# Patient Record
Sex: Male | Born: 1970 | Race: White | Hispanic: No | Marital: Single | Smoking: Former smoker
Health system: Southern US, Community
[De-identification: ages and names within clinical notes are randomized; demographics above are authoritative.]

## PROBLEM LIST (undated history)

## (undated) DIAGNOSIS — G5680 Other specified mononeuropathies of unspecified upper limb: Secondary | ICD-10-CM

## (undated) DIAGNOSIS — G709 Myoneural disorder, unspecified: Secondary | ICD-10-CM

## (undated) DIAGNOSIS — E785 Hyperlipidemia, unspecified: Secondary | ICD-10-CM

## (undated) DIAGNOSIS — M75100 Unspecified rotator cuff tear or rupture of unspecified shoulder, not specified as traumatic: Secondary | ICD-10-CM

## (undated) DIAGNOSIS — M5412 Radiculopathy, cervical region: Secondary | ICD-10-CM

## (undated) HISTORY — DX: Unspecified rotator cuff tear or rupture of unspecified shoulder, not specified as traumatic: M75.100

## (undated) HISTORY — DX: Hyperlipidemia, unspecified: E78.5

## (undated) HISTORY — DX: Other specified mononeuropathies of unspecified upper limb: G56.80

## (undated) HISTORY — DX: Radiculopathy, cervical region: M54.12

## (undated) HISTORY — PX: NECK SURGERY: SHX720

## (undated) HISTORY — DX: Myoneural disorder, unspecified: G70.9

---

## 2004-09-30 ENCOUNTER — Ambulatory Visit: Payer: Self-pay | Admitting: Family Medicine

## 2004-12-30 ENCOUNTER — Ambulatory Visit: Payer: Self-pay | Admitting: Family Medicine

## 2005-01-04 ENCOUNTER — Ambulatory Visit: Payer: Self-pay | Admitting: Family Medicine

## 2005-10-24 ENCOUNTER — Ambulatory Visit: Payer: Self-pay | Admitting: Family Medicine

## 2006-01-05 ENCOUNTER — Ambulatory Visit: Payer: Self-pay | Admitting: Family Medicine

## 2006-01-09 ENCOUNTER — Ambulatory Visit: Payer: Self-pay | Admitting: Family Medicine

## 2007-02-27 ENCOUNTER — Encounter: Payer: Self-pay | Admitting: Family Medicine

## 2007-02-27 DIAGNOSIS — E785 Hyperlipidemia, unspecified: Secondary | ICD-10-CM

## 2007-02-27 DIAGNOSIS — R7989 Other specified abnormal findings of blood chemistry: Secondary | ICD-10-CM | POA: Insufficient documentation

## 2007-03-04 ENCOUNTER — Ambulatory Visit: Payer: Self-pay | Admitting: Family Medicine

## 2007-03-04 LAB — CONVERTED CEMR LAB
AST: 32 units/L (ref 0–37)
Albumin: 3.6 g/dL (ref 3.5–5.2)
Alkaline Phosphatase: 36 units/L — ABNORMAL LOW (ref 39–117)
BUN: 18 mg/dL (ref 6–23)
Bilirubin, Direct: 0.1 mg/dL (ref 0.0–0.3)
Calcium: 8.5 mg/dL (ref 8.4–10.5)
GFR calc non Af Amer: 90 mL/min
Glucose, Bld: 91 mg/dL (ref 70–99)
LDL Cholesterol: 90 mg/dL (ref 0–99)
Potassium: 4.4 meq/L (ref 3.5–5.1)
TSH: 0.75 microintl units/mL (ref 0.35–5.50)
Total Bilirubin: 0.7 mg/dL (ref 0.3–1.2)
Total CHOL/HDL Ratio: 4
Total Protein: 5.9 g/dL — ABNORMAL LOW (ref 6.0–8.3)
Triglycerides: 50 mg/dL (ref 0–149)

## 2007-03-08 ENCOUNTER — Ambulatory Visit: Payer: Self-pay | Admitting: Family Medicine

## 2008-03-06 ENCOUNTER — Ambulatory Visit: Payer: Self-pay | Admitting: Family Medicine

## 2008-03-06 LAB — CONVERTED CEMR LAB
ALT: 50 units/L (ref 0–53)
AST: 51 units/L — ABNORMAL HIGH (ref 0–37)
Alkaline Phosphatase: 50 units/L (ref 39–117)
BUN: 14 mg/dL (ref 6–23)
Bilirubin, Direct: 0.1 mg/dL (ref 0.0–0.3)
Chloride: 103 meq/L (ref 96–112)
Cholesterol: 127 mg/dL (ref 0–200)
GFR calc non Af Amer: 89 mL/min
Total CHOL/HDL Ratio: 3.5
Triglycerides: 44 mg/dL (ref 0–149)

## 2008-03-12 ENCOUNTER — Ambulatory Visit: Payer: Self-pay | Admitting: Family Medicine

## 2008-10-01 ENCOUNTER — Ambulatory Visit: Payer: Self-pay | Admitting: Family Medicine

## 2008-10-01 DIAGNOSIS — M7020 Olecranon bursitis, unspecified elbow: Secondary | ICD-10-CM

## 2008-10-12 ENCOUNTER — Telehealth: Payer: Self-pay | Admitting: Family Medicine

## 2009-02-25 ENCOUNTER — Encounter: Payer: Self-pay | Admitting: Family Medicine

## 2009-02-25 HISTORY — PX: KNEE ARTHROSCOPY W/ PARTIAL MEDIAL MENISCECTOMY: SHX1882

## 2009-02-25 HISTORY — PX: KNEE ARTHROSCOPY W/ ACL RECONSTRUCTION: SHX1858

## 2009-03-10 ENCOUNTER — Ambulatory Visit: Payer: Self-pay | Admitting: Family Medicine

## 2009-03-10 LAB — CONVERTED CEMR LAB
ALT: 31 units/L (ref 0–53)
AST: 31 units/L (ref 0–37)
Albumin: 3.9 g/dL (ref 3.5–5.2)
Alkaline Phosphatase: 40 units/L (ref 39–117)
Bilirubin, Direct: 0 mg/dL (ref 0.0–0.3)
Calcium: 8.5 mg/dL (ref 8.4–10.5)
GFR calc non Af Amer: 99.89 mL/min (ref >60)
HDL: 36.2 mg/dL — ABNORMAL LOW (ref >39.00)
Potassium: 4.2 meq/L (ref 3.5–5.1)
Sodium: 142 meq/L (ref 135–145)
TSH: 0.67 microintl units/mL (ref 0.35–5.50)
Total Protein: 6.3 g/dL (ref 6.0–8.3)

## 2009-03-17 ENCOUNTER — Ambulatory Visit: Payer: Self-pay | Admitting: Family Medicine

## 2009-03-17 DIAGNOSIS — N342 Other urethritis: Secondary | ICD-10-CM | POA: Insufficient documentation

## 2009-03-17 DIAGNOSIS — G5600 Carpal tunnel syndrome, unspecified upper limb: Secondary | ICD-10-CM

## 2009-04-08 ENCOUNTER — Encounter: Payer: Self-pay | Admitting: Family Medicine

## 2009-07-16 ENCOUNTER — Ambulatory Visit: Payer: Self-pay | Admitting: Family Medicine

## 2009-07-16 DIAGNOSIS — A6000 Herpesviral infection of urogenital system, unspecified: Secondary | ICD-10-CM | POA: Insufficient documentation

## 2010-02-25 HISTORY — PX: OTHER SURGICAL HISTORY: SHX169

## 2010-03-15 ENCOUNTER — Telehealth (INDEPENDENT_AMBULATORY_CARE_PROVIDER_SITE_OTHER): Payer: Self-pay | Admitting: *Deleted

## 2010-03-16 ENCOUNTER — Ambulatory Visit: Payer: Self-pay | Admitting: Family Medicine

## 2010-03-16 DIAGNOSIS — R5381 Other malaise: Secondary | ICD-10-CM

## 2010-03-16 DIAGNOSIS — R5383 Other fatigue: Secondary | ICD-10-CM

## 2010-03-16 LAB — CONVERTED CEMR LAB
ALT: 39 units/L (ref 0–53)
AST: 30 units/L (ref 0–37)
Albumin: 4.2 g/dL (ref 3.5–5.2)
Alkaline Phosphatase: 42 units/L (ref 39–117)
Cholesterol: 129 mg/dL (ref 0–200)
Creatinine, Ser: 0.8 mg/dL (ref 0.4–1.5)
Eosinophils Relative: 1.4 % (ref 0.0–5.0)
GFR calc non Af Amer: 115.5 mL/min (ref >60)
Glucose, Bld: 78 mg/dL (ref 70–99)
HCT: 42 % (ref 39.0–52.0)
Hemoglobin: 14.6 g/dL (ref 13.0–17.0)
LDL Cholesterol: 82 mg/dL (ref 0–99)
Lymphocytes Relative: 28.3 % (ref 12.0–46.0)
Neutro Abs: 3 10*3/uL (ref 1.4–7.7)
Neutrophils Relative %: 59.5 % (ref 43.0–77.0)
Platelets: 203 10*3/uL (ref 150.0–400.0)
RBC: 4.47 M/uL (ref 4.22–5.81)
Sodium: 138 meq/L (ref 135–145)
TSH: 0.79 microintl units/mL (ref 0.35–5.50)
Total Bilirubin: 0.7 mg/dL (ref 0.3–1.2)

## 2010-03-23 ENCOUNTER — Ambulatory Visit: Payer: Self-pay | Admitting: Family Medicine

## 2010-03-24 LAB — CONVERTED CEMR LAB

## 2010-03-28 ENCOUNTER — Emergency Department (HOSPITAL_COMMUNITY): Admission: EM | Admit: 2010-03-28 | Discharge: 2010-03-28 | Payer: Self-pay | Admitting: Emergency Medicine

## 2010-03-31 ENCOUNTER — Encounter (INDEPENDENT_AMBULATORY_CARE_PROVIDER_SITE_OTHER): Payer: Self-pay | Admitting: *Deleted

## 2010-05-08 ENCOUNTER — Encounter: Payer: Self-pay | Admitting: Family Medicine

## 2010-08-11 ENCOUNTER — Ambulatory Visit: Payer: Self-pay | Admitting: Family Medicine

## 2010-08-11 DIAGNOSIS — H11009 Unspecified pterygium of unspecified eye: Secondary | ICD-10-CM | POA: Insufficient documentation

## 2010-09-27 NOTE — Letter (Signed)
Summary: [employer] Form/[employer]   Imported By: Sherian Rein 05/13/2010 13:22:45  _____________________________________________________________________  External Attachment:    Type:   Image     Comment:   External Document

## 2010-09-27 NOTE — Assessment & Plan Note (Signed)
Summary: CPX AND ESTABH FROM SCHALLER/DLO   Vital Signs:  Patient profile:   40 year old male Height:      70 inches Weight:      178.0 pounds BMI:     25.63 Temp:     98.6 degrees F oral Pulse rate:   76 / minute Pulse rhythm:   regular BP sitting:   102 / 76  (left arm) Cuff size:   regular  Vitals Entered By: Benny Lennert CMA Duncan Dull) (March 23, 2010 2:44 PM)  History of Present Illness: Chief complaint CPX,est from dr schaller  40 year old male:  Dorene Grebe, ACL reconstruction and meniscal, partial menisectomy on L     Preventive Screening-Counseling & Management  Alcohol-Tobacco     Alcohol drinks/day: <1     Alcohol type: wine  occas beer     Smoking Status: quit     Year Quit: 1992     Pack years: 5     Passive Smoke Exposure: no  Caffeine-Diet-Exercise     Caffeine use/day: 3     Does Patient Exercise: yes     Type of exercise: mountain bike     Times/week: 5  Hep-HIV-STD-Contraception     HIV Risk: risk noted     STD Risk: risk noted     STD Risk Counseling: to avoid increased STD risk     Testicular SE Education/Counseling to perform regular STE  Safety-Violence-Falls     Seat Belt Use: 100      Sexual History:  single now.        Drug Use:  never.    Clinical Review Panels:  Immunizations   Last Tetanus Booster:  Td (09/30/2004)  Lipid Management   Cholesterol:  129 (03/16/2010)   LDL (bad choesterol):  82 (03/16/2010)   HDL (good cholesterol):  34.10 (03/16/2010)  CBC   WBC:  5.1 (03/16/2010)   RBC:  4.47 (03/16/2010)   Hgb:  14.6 (03/16/2010)   Hct:  42.0 (03/16/2010)   Platelets:  203.0 (03/16/2010)   MCV  94.0 (03/16/2010)   MCHC  34.7 (03/16/2010)   RDW  12.8 (03/16/2010)   PMN:  59.5 (03/16/2010)   Lymphs:  28.3 (03/16/2010)   Monos:  10.0 (03/16/2010)   Eosinophils:  1.4 (03/16/2010)   Basophil:  0.8 (03/16/2010)  Complete Metabolic Panel   Glucose:  78 (03/16/2010)   Sodium:  138 (03/16/2010)   Potassium:  4.4  (03/16/2010)   Chloride:  103 (03/16/2010)   CO2:  29 (03/16/2010)   BUN:  15 (03/16/2010)   Creatinine:  0.8 (03/16/2010)   Albumin:  4.2 (03/16/2010)   Total Protein:  6.5 (03/16/2010)   Calcium:  8.9 (03/16/2010)   Total Bili:  0.7 (03/16/2010)   Alk Phos:  42 (03/16/2010)   SGPT (ALT):  39 (03/16/2010)   SGOT (AST):  30 (03/16/2010)   Allergies (verified): No Known Drug Allergies  Past History:  Past medical, surgical, family and social histories (including risk factors) reviewed, and no changes noted (except as noted below).  Past Medical History: Hyperlipidemia ACL, 02/2010, reconstruction  Past Surgical History: L ACL reconstruction, 02/2009, allograft, Dean L partial menisectomy, 02/2009, [place]  Family History: Reviewed history from 03/17/2009 and no changes required. Father: dec 80 DM, leiomyosarcoma leg Mother: Alive 49 CAD Brother A 29 Sister A 59 Sister A 49 CV:  + Mother CABG x 3 DM:  + Father Stroke:  (-)  Social History: Reviewed history from 07/16/2009 and no changes  required. Former Smoker 5 PYH, quit age 62 Alcohol use-yes. 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 Eyes: Denies blurring. ENT: Denies earache, ear discharge, decreased hearing, nasal congestion, and sore throat. CV: Denies chest pains, dyspnea on exertion, palpitations, and syncope. Resp: Denies cough, cough with exercise, dyspnea at rest, excessive sputum, nighttime cough or wheeze, and wheezing GI: Denies nausea, vomiting, diarrhea, constipation, change in bowel habits, abdominal pain, melena, BRBPR  GU: dysuria, discharge, frequency,genital sores, STD  concern. MS: oc swelling l knee Derm: No rash, itching, and dryness Neuro: No abnormal gait, frequent headaches, paresthesias, seizures, vertigo, and weakness Psych: No anxiety, behavioral problems, compulsive behavior, depression, hyperactivity, and inattentive. Endo: No polydipsia, polyphagia, polyuria, and unusual weight change Heme: No bruising or LAD Allergy: No urticaria or hayfever   Otherwise, the pertinent positives and negatives are listed above and in the HPI, otherwise a full review of systems has been reviewed and is negative unless noted positive.    Impression & Recommendations:  Problem # 1:  HEALTH MAINTENANCE EXAM (ICD-V70.0) The patient's preventative maintenance and recommended screening tests for an annual wellness exam were reviewed in full today. Brought up to date unless services declined.  Counselled on the importance of diet, exercise, and its role in overall health and mortality. The patient's FH and SH was reviewed, including their home life, tobacco status, and drug and alcohol status.   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 1 daily by mouth 2)  Ibuprofen 200 Mg Tabs (Ibuprofen) .... As needed 3)  Valacyclovir Hcl 1 Gm Tabs (Valacyclovir hcl) .Marland Kitchen.. 1 by mouth daily Prescriptions: VALACYCLOVIR HCL 1 GM TABS (VALACYCLOVIR HCL) 1 by mouth daily  #30 x 11   Entered and Authorized by:   Hannah Beat MD   Signed by:   Hannah Beat MD on 03/23/2010   Method used:   Print then Give to Patient   RxID:   [id number]   Current Allergies (reviewed today): No known allergies  Physical Exam General Appearance: well developed, well  nourished, no acute distress Eyes: conjunctiva and lids normal, PERRLA, EOMI Ears, Nose, Mouth, Throat: TM clear, nares clear, oral exam WNL Neck: supple, no lymphadenopathy, no thyromegaly, no JVD Respiratory: clear to auscultation and percussion, respiratory effort normal Cardiovascular: regular rate and rhythm, S1-S2, no murmur, rub or gallop, no bruits, peripheral pulses normal and symmetric, no cyanosis, clubbing, edema or varicosities Chest: no scars, masses, tenderness; no asymmetry, skin changes, nipple discharge, no gynecomastia   Gastrointestinal: soft, non-tender; no hepatosplenomegaly, masses; active bowel sounds all quadrants, Genitourinary: no hernia, testicular mass, penile discharge, priapism or prostate enlargement Lymphatic: no cervical, axillary or inguinal adenopathy Musculoskeletal: gait normal, muscle tone and strength WNL, no joint swelling, effusions, discoloration, crepitus  Skin: clear, good turgor, color WNL, no rashes, lesions, or ulcerations Neurologic: normal mental status, normal reflexes, normal strength, sensation, and motion Psychiatric: alert; oriented to person, place and time Other Exam:

## 2010-09-27 NOTE — Progress Notes (Signed)
----   Converted from flag ---- ---- 03/15/2010 1:03 PM, Hannah Beat MD wrote: Prephysical Labs, several days before, fasting BMP, HFP, FLP, CBC with diff, TSH v77.91, v77.1, ,780.79  ---- 03/15/2010 12:57 PM, Liane Comber CMA (AAMA) wrote: Pt is scheduled for cpx labs tomorrow, what labs to draw and dx codes? Thanks Tasha ------------------------------

## 2010-09-27 NOTE — Letter (Signed)
Summary: Nadara Eaton letter  [place] at [facility]  [address] [place], [place] [address]   Phone: [phone]  Fax: [phone]       03/31/2010 MRN: [id number]  [facility] [address] [place], [place]  [address]  Dear Mr. HOOPES,  [facility] [place], and [facility] announce the retirement of Arta Silence, M.D., from full-time practice at the [facility] office effective February 24, 2010 and his plans of returning part-time.  It is important to Dr. Hetty Ely and to our practice that you understand that [facility] has seven physicians in our office for your health care needs.  We will continue to offer the same exceptional care that you have today.    Dr. Hetty Ely has spoken to many of you about his plans for retirement and returning part-time in the fall.   We will continue to work with you through the transition to schedule appointments for you in the office and meet the high standards that [facility] is committed to.   Again, it is with great pleasure that we share the news that Dr. Hetty Ely will return to [facility] at [facility] in October of 2011 with a reduced schedule.    If you have any questions, or would like to request an appointment with one of our physicians, please call us at [phone] and press the option for Scheduling an appointment.  We take pleasure in providing you with excellent patient care and look forward to seeing you at your next office visit.  Our [facility] Physicians are:  Tillman Abide, M.D. Laurita Quint, M.D. Roxy Manns, M.D. Kerby Nora, M.D. Hannah Beat, M.D. Ruthe Mannan, M.D. We proudly welcomed Raechel Ache, M.D. and Eustaquio Boyden, M.D. to the practice in July/August 2011.  Sincerely,  [facility]

## 2010-09-29 NOTE — Assessment & Plan Note (Signed)
Summary: CHECK PLACE ON LEFT EYE/CLE   Vital Signs:  Patient profile:   40 year old male Height:      70 inches Weight:      175.8 pounds BMI:     25.32 Temp:     98.3 degrees F oral Pulse rate:   76 / minute Pulse rhythm:   regular BP sitting:   120 / 70  (left arm) Cuff size:   regular  Vitals Entered By: Benny Lennert CMA Duncan Dull) (August 11, 2010 2:05 PM)  History of Present Illness: Chief complaint check place on left eye  Pterygium: L medial eye  the patient comes in concerned about a small slightly raised area on the medial aspect of his iris. No trauma no injury, and this causes no pain. No itching.  This is some redness, but is lifelong had some prominent vasculature in this area.  Review of systems as above.  GEN: Well-developed,well-nourished,in no acute distress; alert,appropriate and cooperative throughout examination Eyes:   HEENT: Normocephalic and atraumatic without obvious abnormalities. No apparent alopecia or balding. Ears, externally no deformities PULM: Breathing comfortably in no respiratory distress EXT: No clubbing, cyanosis, or edema PSYCH: Normally interactive. Cooperative during the interview. Pleasant. Friendly and conversant. Not anxious or depressed appearing. Normal, full affect.   Allergies (verified): No Known Drug Allergies   Impression & Recommendations:  Problem # 1:  PTERYGIUM (ICD-372.40) Assessment New reassured follow for now  Complete Medication List: 1)  One-daily Multivitamins Tabs (Multiple vitamin) .Marland Kitchen.. 1 daily by mouth 2)  Ibuprofen 200 Mg Tabs (Ibuprofen) .... As needed 3)  Valacyclovir Hcl 1 Gm Tabs (Valacyclovir hcl) .Marland Kitchen.. 1 by mouth daily   Orders Added: 1)  Est. Patient Level III [56213]    Current Allergies (reviewed today): No known allergies

## 2010-11-02 ENCOUNTER — Encounter: Payer: Self-pay | Admitting: Family Medicine

## 2010-11-02 ENCOUNTER — Ambulatory Visit (INDEPENDENT_AMBULATORY_CARE_PROVIDER_SITE_OTHER): Payer: 59 | Admitting: Family Medicine

## 2010-11-02 DIAGNOSIS — Z209 Contact with and (suspected) exposure to unspecified communicable disease: Secondary | ICD-10-CM

## 2010-11-03 LAB — CONVERTED CEMR LAB

## 2010-11-08 NOTE — Assessment & Plan Note (Signed)
Summary: personal/alc   Vital Signs:  Patient profile:   40 year old male Height:      70 inches Weight:      178.50 pounds BMI:     25.70 Temp:     98.6 degrees F oral Pulse rate:   76 / minute Pulse rhythm:   regular BP sitting:   110 / 60  (left arm) Cuff size:   regular  Vitals Entered By: Benny Lennert CMA Duncan Dull) (November 02, 2010 10:08 AM)  History of Present Illness: Chief complaint personal  ? STD exposure no prot  Had a vasectomy last year 1 partner thinks she has had other partners possibly, since they broke up but have also been together recently no penile discharge or lesions hsv+  GEN: Well-developed,well-nourished,in no acute distress; alert,appropriate and cooperative throughout examination HEENT: Normocephalic and atraumatic without obvious abnormalities. No apparent alopecia or balding. Ears, externally no deformities PULM: Breathing comfortably in no respiratory distress EXT: No clubbing, cyanosis, or edema PSYCH: Normally interactive. Cooperative during the interview. Pleasant. Friendly and conversant. Not anxious or depressed appearing. Normal, full affect. 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 1 daily by mouth 2)  Ibuprofen 200 Mg Tabs (Ibuprofen) .... As needed 3)  Valacyclovir Hcl 1 Gm Tabs (Valacyclovir hcl) .Marland Kitchen.. 1 by mouth daily   Orders Added: 1)  Venipuncture [36644] 2)  T-Chlamydia  Probe, urine [id number] 3)  T-GC Probe, urine [phone] 4)  T-HIV Antibody  (Reflex) [[id number]] 5)  T-RPR  (Syphilis) [30160-10932] 6)  Est. Patient Level III [35573]    Current Allergies (reviewed today): No known allergies

## 2010-11-11 ENCOUNTER — Encounter: Payer: Self-pay | Admitting: Family Medicine

## 2010-11-11 ENCOUNTER — Ambulatory Visit (INDEPENDENT_AMBULATORY_CARE_PROVIDER_SITE_OTHER): Payer: 59 | Admitting: Family Medicine

## 2010-11-11 DIAGNOSIS — R05 Cough: Secondary | ICD-10-CM | POA: Insufficient documentation

## 2010-11-24 NOTE — Assessment & Plan Note (Signed)
Summary: COUGH,SINUS PRESSURE/CLE   [employer]   Vital Signs:  Patient profile:   40 year old male Height:      70 inches Weight:      178.75 pounds BMI:     25.74 Temp:     98.4 degrees F oral Pulse rate:   60 / minute Pulse rhythm:   regular BP sitting:   136 / 72  (left arm) Cuff size:   regular  Vitals Entered By: Delilah Shan CMA Oree Hislop Dull) (November 11, 2010 10:33 AM) CC: Cough, sinus pressure   History of Present Illness: 2.5 weeks of cough and congestions.  cough increased at night.  Coughing fits.  "Tickle in my throat."  Some sinus pressure.  More nasal discharge in the AM.  No FCNAV.  No help with mucinex.  No wheeze.  Scratchy throat.  No wheeze. This is worse than normal for the spring.   Allergies: No Known Drug Allergies  Review of Systems       See HPI.  Otherwise negative.    Physical Exam  General:  GEN: nad, alert and oriented HEENT: mucous membranes moist, TM w/o erythema, nasal epithelium injected, OP with cobblestoning, max/frontal sinuses not tender to palpation  NECK: supple w/o LA CV: rrr. PULM: ctab, no inc wob EXT: no edema    Impression & Recommendations:  Problem # 1:  COUGH (ICD-786.2) likely due to post nasal gtt. I would treat for allergic rhinitis with claritin and call back as needed.  this doesn't appear to be infectious.  He agrees with plan.   Complete Medication List: 1)  One-daily Multivitamins Tabs (Multiple vitamin) .Marland Kitchen.. 1 daily by mouth 2)  Ibuprofen 200 Mg Tabs (Ibuprofen) .... As needed 3)  Valacyclovir Hcl 1 Gm Tabs (Valacyclovir hcl) .Marland Kitchen.. 1 by mouth daily  Patient Instructions: 1)  I would take plain claritin (loratadine) 10mg  a day and use nasal saline several times a day.  Call me if you aren't improving by the middle of next week.  Take care.  Glad to see you.    Orders Added: 1)  Est. Patient Level III [60454]    Current Allergies (reviewed today): No known allergies

## 2011-01-16 ENCOUNTER — Encounter: Payer: Self-pay | Admitting: Family Medicine

## 2011-01-16 ENCOUNTER — Ambulatory Visit (INDEPENDENT_AMBULATORY_CARE_PROVIDER_SITE_OTHER): Payer: 59 | Admitting: Family Medicine

## 2011-01-16 VITALS — BP 122/58 | HR 48 | Temp 98.6°F | Wt 174.1 lb

## 2011-01-16 DIAGNOSIS — N509 Disorder of male genital organs, unspecified: Secondary | ICD-10-CM

## 2011-01-16 DIAGNOSIS — Z209 Contact with and (suspected) exposure to unspecified communicable disease: Secondary | ICD-10-CM
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
## 2011-01-16 NOTE — Patient Instructions (Signed)
We'll contact you with your lab report. Take care.    

## 2011-01-16 NOTE — Assessment & Plan Note (Signed)
Check labs.  D/w pt about safer sex.  If HIV negative, encouraged pt for fu HIV test in 2 months.  He understood.

## 2011-01-17 ENCOUNTER — Ambulatory Visit: Payer: 59 | Admitting: Family Medicine

## 2011-01-17 LAB — RPR

## 2011-01-17 LAB — HIV ANTIBODY (ROUTINE TESTING W REFLEX): HIV: NONREACTIVE

## 2011-01-17 LAB — GC/CHLAMYDIA PROBE AMP, URINE: GC Probe Amp, Urine: NEGATIVE

## 2011-03-21 ENCOUNTER — Other Ambulatory Visit: Payer: Self-pay | Admitting: Family Medicine

## 2011-03-21 DIAGNOSIS — A6000 Herpesviral infection of urogenital system, unspecified: Secondary | ICD-10-CM

## 2011-03-21 DIAGNOSIS — R7989 Other specified abnormal findings of blood chemistry: Secondary | ICD-10-CM

## 2011-03-21 DIAGNOSIS — R5381 Other malaise: Secondary | ICD-10-CM

## 2011-03-21 DIAGNOSIS — R5383 Other fatigue: Secondary | ICD-10-CM

## 2011-03-21 DIAGNOSIS — E785 Hyperlipidemia, unspecified: Secondary | ICD-10-CM

## 2011-03-23 ENCOUNTER — Other Ambulatory Visit (INDEPENDENT_AMBULATORY_CARE_PROVIDER_SITE_OTHER): Payer: 59 | Admitting: Family Medicine

## 2011-03-23 DIAGNOSIS — R7989 Other specified abnormal findings of blood chemistry: Secondary | ICD-10-CM

## 2011-03-23 DIAGNOSIS — E785 Hyperlipidemia, unspecified: Secondary | ICD-10-CM

## 2011-03-23 DIAGNOSIS — R5383 Other fatigue: Secondary | ICD-10-CM

## 2011-03-23 DIAGNOSIS — R5381 Other malaise: Secondary | ICD-10-CM

## 2011-03-23 DIAGNOSIS — A6000 Herpesviral infection of urogenital system, unspecified: Secondary | ICD-10-CM

## 2011-03-23 LAB — RENAL FUNCTION PANEL
Albumin: 4.3 g/dL (ref 3.5–5.2)
CO2: 25 mEq/L (ref 19–32)
Calcium: 8.4 mg/dL (ref 8.4–10.5)
Creatinine, Ser: 1 mg/dL (ref 0.4–1.5)
GFR: 89.61 mL/min (ref >60.00)
Glucose, Bld: 97 mg/dL (ref 70–99)
Phosphorus: 3 mg/dL (ref 2.3–4.6)
Sodium: 139 mEq/L (ref 135–145)

## 2011-03-23 LAB — HEPATIC FUNCTION PANEL
AST: 34 U/L (ref 0–37)
Total Bilirubin: 0.6 mg/dL (ref 0.3–1.2)
Total Protein: 7 g/dL (ref 6.0–8.3)

## 2011-03-23 LAB — CBC WITH DIFFERENTIAL/PLATELET
Eosinophils Relative: 0.9 % (ref 0.0–5.0)
Lymphocytes Relative: 23 % (ref 12.0–46.0)
Lymphs Abs: 1.3 10*3/uL (ref 0.7–4.0)
Neutrophils Relative %: 68.5 % (ref 43.0–77.0)
WBC: 5.5 10*3/uL (ref 4.5–10.5)

## 2011-03-23 LAB — MICROALBUMIN / CREATININE URINE RATIO
Creatinine,U: 204.4 mg/dL
Microalb Creat Ratio: 0.2 mg/g (ref 0.0–30.0)
Microalb, Ur: 0.5 mg/dL (ref 0.0–1.9)

## 2011-03-23 LAB — LIPID PANEL
Cholesterol: 122 mg/dL (ref 0–200)
LDL Cholesterol: 70 mg/dL (ref 0–99)
Total CHOL/HDL Ratio: 3
Triglycerides: 65 mg/dL (ref 0.0–149.0)

## 2011-03-23 LAB — TSH: TSH: 0.54 u[IU]/mL (ref 0.35–5.50)

## 2011-03-24 LAB — HIV ANTIBODY (ROUTINE TESTING W REFLEX): HIV: NONREACTIVE

## 2011-03-29 ENCOUNTER — Encounter: Payer: Self-pay | Admitting: Family Medicine

## 2011-03-29 ENCOUNTER — Ambulatory Visit (INDEPENDENT_AMBULATORY_CARE_PROVIDER_SITE_OTHER): Payer: 59 | Admitting: Family Medicine

## 2011-03-29 DIAGNOSIS — A6000 Herpesviral infection of urogenital system, unspecified: Secondary | ICD-10-CM

## 2011-03-29 DIAGNOSIS — R7989 Other specified abnormal findings of blood chemistry: Secondary | ICD-10-CM

## 2011-03-29 DIAGNOSIS — E785 Hyperlipidemia, unspecified: Secondary | ICD-10-CM

## 2011-03-29 DIAGNOSIS — R5383 Other fatigue: Secondary | ICD-10-CM

## 2011-03-29 DIAGNOSIS — R5381 Other malaise: Secondary | ICD-10-CM

## 2011-03-29 DIAGNOSIS — Z209 Contact with and (suspected) exposure to unspecified communicable disease: Secondary | ICD-10-CM

## 2011-03-29 NOTE — Assessment & Plan Note (Signed)
Good control. HDL slightly low but otherwise great numbers.

## 2011-03-29 NOTE — Assessment & Plan Note (Signed)
Appears to have been due to work schedule which has stabilized and he feels back to normal.

## 2011-03-29 NOTE — Assessment & Plan Note (Signed)
97 today after 70s last year. Cautioned to avoid sweets and carbs as much as possible. Microalb nml.

## 2011-03-29 NOTE — Progress Notes (Signed)
  Subjective:    Patient ID: Peter Lyons, male    DOB: [birth year], 40 y.o.   MRN: [id number]  HPI Pt here for Comp Exam. He had labs done earlier for sexual encounter, all negative and I repeated his HIV again, which is still negative and we discussed. He went back to see Dr Vernie Ammons for recurrent pain in the scrotum after his Vas and the pain was attributed to lots of cycling. He hasn't worn bike shorts for over a year and possibly needs to restart. He has no complaints today except for hemmorhoids. He denies abd straining. He uses baby wipes. He was told to use water for cleansing.    Review of Systems  Constitutional: Negative for fever, chills, diaphoresis, appetite change, fatigue and unexpected weight change.  HENT: Negative for hearing loss, ear pain, tinnitus and ear discharge.        Works around [employer] and has tinnitus if in the silence of an empty room.  Eyes: Negative for pain, discharge and visual disturbance.  Respiratory: Negative for cough, shortness of breath and wheezing.   Cardiovascular: Negative for chest pain and palpitations.       No SOB w/ exertion  Gastrointestinal: Negative for nausea, vomiting, abdominal pain, diarrhea, constipation and blood in stool.       No heartburn or swallowing problems. Has hemms (see HPI).  Genitourinary: Positive for penile swelling. Negative for dysuria, frequency and difficulty urinating.       No nocturia  Musculoskeletal: Positive for arthralgias (chronic knee discomfort. Has had tibial plateau fracture of left knee last year and had knee surgery on that knee in the past.). Negative for myalgias and back pain.  Skin: Negative for rash.       No itching or dryness.  Neurological: Negative for tremors and numbness.       No tingling or balance problems.  Hematological: Negative for adenopathy. Does not bruise/bleed easily.  Psychiatric/Behavioral: Negative for dysphoric mood and agitation.       Objective:   Physical  Exam  Constitutional: He is oriented to person, place, and time. He appears well-developed and well-nourished. No distress.  HENT:  Head: Normocephalic and atraumatic.  Right Ear: External ear normal.  Left Ear: External ear normal.  Nose: Nose normal.  Mouth/Throat: Oropharynx is clear and moist.  Eyes: Conjunctivae and EOM are normal. Pupils are equal, round, and reactive to light. Right eye exhibits no discharge. Left eye exhibits no discharge. No scleral icterus.  Neck: Normal range of motion. Neck supple. No thyromegaly present.  Cardiovascular: Normal rate, regular rhythm, normal heart sounds and intact distal pulses.   No murmur heard. Pulmonary/Chest: Effort normal and breath sounds normal. No respiratory distress. He has no wheezes.  Abdominal: Soft. Bowel sounds are normal. He exhibits no distension and no mass. There is no tenderness. There is no rebound and no guarding.  Genitourinary: Penis normal.       No hernias felt.  Musculoskeletal: Normal range of motion. He exhibits no edema.  Lymphadenopathy:    He has no cervical adenopathy.  Neurological: He is alert and oriented to person, place, and time. Coordination normal.  Skin: Skin is warm and dry. No rash noted. He is not diaphoretic.  Psychiatric: He has a normal mood and affect. His behavior is normal. Judgment and thought content normal.          Assessment & Plan:  HMPE

## 2011-03-29 NOTE — Assessment & Plan Note (Signed)
All tests negative including repeat HIV. Reassured.

## 2011-03-29 NOTE — Assessment & Plan Note (Signed)
Stable

## 2011-07-07 ENCOUNTER — Other Ambulatory Visit: Payer: Self-pay | Admitting: *Deleted

## 2011-07-07 MED ORDER — VALACYCLOVIR HCL 1 G PO TABS: 1000.0000 mg | ORAL_TABLET | Freq: Every day | ORAL | Status: DC

## 2011-07-11 ENCOUNTER — Telehealth: Payer: Self-pay | Admitting: Family Medicine

## 2011-07-11 NOTE — Telephone Encounter (Signed)
Patient notified that [facility] on [facility]07/2011. Spoke to pharmacist and was advised that they have the prescription ready for the patient to pickup.

## 2011-07-11 NOTE — Telephone Encounter (Signed)
[facility].  Insurance has changed and would like [facility].  Patient call back is [phone].

## 2012-03-20 ENCOUNTER — Other Ambulatory Visit: Payer: Self-pay | Admitting: Family Medicine

## 2012-03-20 DIAGNOSIS — E7800 Pure hypercholesterolemia, unspecified: Secondary | ICD-10-CM

## 2012-03-26 ENCOUNTER — Other Ambulatory Visit (INDEPENDENT_AMBULATORY_CARE_PROVIDER_SITE_OTHER): Payer: 59

## 2012-03-26 DIAGNOSIS — E7800 Pure hypercholesterolemia, unspecified: Secondary | ICD-10-CM

## 2012-03-26 LAB — LIPID PANEL
Cholesterol: 142 mg/dL (ref 0–200)
LDL Cholesterol: 92 mg/dL (ref 0–99)
Triglycerides: 64 mg/dL (ref 0.0–149.0)
VLDL: 12.8 mg/dL (ref 0.0–40.0)

## 2012-03-26 LAB — COMPREHENSIVE METABOLIC PANEL
ALT: 40 U/L (ref 0–53)
AST: 33 U/L (ref 0–37)
Albumin: 4 g/dL (ref 3.5–5.2)
Calcium: 8.9 mg/dL (ref 8.4–10.5)
Chloride: 106 mEq/L (ref 96–112)
Creatinine, Ser: 1 mg/dL (ref 0.4–1.5)
GFR: 90.23 mL/min (ref >60.00)
Glucose, Bld: 90 mg/dL (ref 70–99)
Sodium: 141 mEq/L (ref 135–145)
Total Protein: 6.7 g/dL (ref 6.0–8.3)

## 2012-04-02 ENCOUNTER — Encounter: Payer: 59 | Admitting: Family Medicine

## 2012-04-04 ENCOUNTER — Encounter: Payer: Self-pay | Admitting: Family Medicine

## 2012-04-04 ENCOUNTER — Ambulatory Visit (INDEPENDENT_AMBULATORY_CARE_PROVIDER_SITE_OTHER): Payer: 59 | Admitting: Family Medicine

## 2012-04-04 VITALS — BP 108/66 | HR 54 | Temp 98.3°F | Ht 70.0 in | Wt 184.8 lb

## 2012-04-04 DIAGNOSIS — R591 Generalized enlarged lymph nodes: Secondary | ICD-10-CM

## 2012-04-04 DIAGNOSIS — R599 Enlarged lymph nodes, unspecified: Secondary | ICD-10-CM

## 2012-04-04 DIAGNOSIS — Z0000 Encounter for general adult medical examination without abnormal findings: Secondary | ICD-10-CM

## 2012-04-04 DIAGNOSIS — IMO0002 Reserved for concepts with insufficient information to code with codable children: Secondary | ICD-10-CM

## 2012-04-04 DIAGNOSIS — K649 Unspecified hemorrhoids: Secondary | ICD-10-CM

## 2012-04-04 DIAGNOSIS — Z209 Contact with and (suspected) exposure to unspecified communicable disease: Secondary | ICD-10-CM

## 2012-04-04 DIAGNOSIS — T148XXA Other injury of unspecified body region, initial encounter: Secondary | ICD-10-CM

## 2012-04-04 DIAGNOSIS — A6000 Herpesviral infection of urogenital system, unspecified: Secondary | ICD-10-CM

## 2012-04-04 MED ORDER — VALACYCLOVIR HCL 1 G PO TABS: 1000.0000 mg | ORAL_TABLET | Freq: Every day | ORAL | Status: DC | PRN
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
## 2012-04-04 NOTE — Patient Instructions (Signed)
Keep your groin dry when biking.  Your labs are fine.  You don't have any noticeable lymph nodes.  If you have more lumps, notify me. Keep using prep H.  I would get a flu shot each fall.   Redo your tetanus shot in 2016.   Go to the lab on the way out.  We'll contact you with your lab report.

## 2012-04-05 ENCOUNTER — Encounter: Payer: 59 | Admitting: Family Medicine

## 2012-04-05 DIAGNOSIS — K649 Unspecified hemorrhoids: Secondary | ICD-10-CM | POA: Insufficient documentation

## 2012-04-05 DIAGNOSIS — T148XXA Other injury of unspecified body region, initial encounter: Secondary | ICD-10-CM | POA: Insufficient documentation

## 2012-04-05 DIAGNOSIS — Z0000 Encounter for general adult medical examination without abnormal findings: Secondary | ICD-10-CM | POA: Insufficient documentation

## 2012-04-05 DIAGNOSIS — R591 Generalized enlarged lymph nodes: Secondary | ICD-10-CM | POA: Insufficient documentation

## 2012-04-05 LAB — HIV ANTIBODY (ROUTINE TESTING W REFLEX): HIV: NONREACTIVE

## 2012-04-05 LAB — GC/CHLAMYDIA PROBE AMP, URINE
Chlamydia, Swab/Urine, PCR: NEGATIVE
GC Probe Amp, Urine: NEGATIVE

## 2012-04-05 NOTE — Assessment & Plan Note (Signed)
On legs, superficial and should resolve.  No sign of infected lesions.  F/u prn.

## 2012-04-05 NOTE — Assessment & Plan Note (Addendum)
Continue current meds, used occ with much fewer outbreaks. Check for other STDs today.  See notes on labs when resulted.  Normal genital exam today.  He does likely have a history of irritation from cycling.

## 2012-04-05 NOTE — Assessment & Plan Note (Signed)
Routine anticipatory guidance given to patient.  See health maintenance. Tetanus 2006 Flu shot encouraged. Labs d/w pt.   Exercising.   Living will d/w pt.  Brother is designated Product manager.   He requested STD testing.

## 2012-04-05 NOTE — Assessment & Plan Note (Signed)
Likely cause, now resolved and f/u prn.  Benign exam.

## 2012-04-05 NOTE — Assessment & Plan Note (Signed)
He'll notify me if worse.

## 2013-03-19 ENCOUNTER — Other Ambulatory Visit: Payer: Self-pay | Admitting: Family Medicine

## 2013-03-19 DIAGNOSIS — Z8249 Family history of ischemic heart disease and other diseases of the circulatory system: Secondary | ICD-10-CM

## 2013-04-01 ENCOUNTER — Other Ambulatory Visit (INDEPENDENT_AMBULATORY_CARE_PROVIDER_SITE_OTHER): Payer: 59

## 2013-04-01 DIAGNOSIS — Z0000 Encounter for general adult medical examination without abnormal findings: Secondary | ICD-10-CM

## 2013-04-01 DIAGNOSIS — Z8249 Family history of ischemic heart disease and other diseases of the circulatory system: Secondary | ICD-10-CM

## 2013-04-01 LAB — GLUCOSE, RANDOM: Glucose, Bld: 88 mg/dL (ref 70–99)

## 2013-04-01 LAB — LIPID PANEL
Cholesterol: 140 mg/dL (ref 0–200)
HDL: 39 mg/dL — ABNORMAL LOW (ref >39.00)
Triglycerides: 58 mg/dL (ref 0.0–149.0)
VLDL: 11.6 mg/dL (ref 0.0–40.0)

## 2013-04-05 ENCOUNTER — Other Ambulatory Visit: Payer: Self-pay | Admitting: Family Medicine

## 2013-04-07 ENCOUNTER — Encounter: Payer: Self-pay | Admitting: Family Medicine

## 2013-04-07 ENCOUNTER — Ambulatory Visit (INDEPENDENT_AMBULATORY_CARE_PROVIDER_SITE_OTHER): Payer: 59 | Admitting: Family Medicine

## 2013-04-07 VITALS — BP 108/68 | HR 60 | Temp 98.4°F | Ht 70.0 in | Wt 178.2 lb

## 2013-04-07 DIAGNOSIS — K649 Unspecified hemorrhoids: Secondary | ICD-10-CM

## 2013-04-07 DIAGNOSIS — M25519 Pain in unspecified shoulder: Secondary | ICD-10-CM | POA: Insufficient documentation

## 2013-04-07 DIAGNOSIS — A6000 Herpesviral infection of urogenital system, unspecified: Secondary | ICD-10-CM

## 2013-04-07 DIAGNOSIS — M549 Dorsalgia, unspecified: Secondary | ICD-10-CM

## 2013-04-07 DIAGNOSIS — M25511 Pain in right shoulder: Secondary | ICD-10-CM

## 2013-04-07 DIAGNOSIS — Z0000 Encounter for general adult medical examination without abnormal findings: Secondary | ICD-10-CM

## 2013-04-07 MED ORDER — VALACYCLOVIR HCL 1 G PO TABS: 1000.0000 mg | ORAL_TABLET | Freq: Every day | ORAL | Status: DC | PRN

## 2013-04-07 NOTE — Progress Notes (Signed)
CPE- See plan.  Routine anticipatory guidance given to patient.  See health maintenance. Tetanus 2006 Flu shot done prev, encouraged.  Diet and exercise d/w pt.  Diet is good and biking for exercise.  Living will d/w pt.  Has brother [name] designated if incapacitated.   Labs d/w pt.  Prostate and colon cancer screening not indicated.    Used valtrex prn.  No ADE.  Doing well.   R lower back pain, no trauma.  Not stretching much.  No radicular sx. Not acute.  No L sided sx  R shoulder.  Larey Seat off bike a few weeks ago.  Per patient, xrays at outside facility were neg.  Improved pain in meantime.  Prev with pain on abduction.    Hemorrhoids.  Not acute.  Itching but not bleeding.   PMH and SH reviewed  Meds, vitals, and allergies reviewed.   ROS: See HPI.  Otherwise negative.    GEN: nad, alert and oriented HEENT: mucous membranes moist NECK: supple w/o LA CV: rrr. PULM: ctab, no inc wob ABD: soft, +bs EXT: no edema SKIN: no acute rash R shoulder with normal ROM, no pain in int/ext rotation, no impingement.  AC testing wnl, no arm drop. Distally NV intact Ext rectal exam with old/resolving hemorrhoids, skin tags.  No gross blood.

## 2013-04-07 NOTE — Assessment & Plan Note (Signed)
Can use otc cream or witch hazel prn.  D/w pt.

## 2013-04-07 NOTE — Assessment & Plan Note (Signed)
Normal exam today, resolving, could have been a strain of cuff vs bursitis.  Improved, f/u prn. He agrees. D/w pt.

## 2013-04-07 NOTE — Assessment & Plan Note (Signed)
SI testing wnl, no midline pain.  R lower back isn't ttp.  Likely MSK tightness that would respond to stretching, discussed. F/u prn.

## 2013-04-07 NOTE — Assessment & Plan Note (Signed)
Continue prn valtrex.  No ADE.

## 2013-04-07 NOTE — Assessment & Plan Note (Signed)
Routine anticipatory guidance given to patient. See health maintenance.  Tetanus 2006  Flu shot done prev, encouraged.  Diet and exercise d/w pt. Diet is good and biking for exercise.  Living will d/w pt. Has brother [name] designated if incapacitated.  Labs d/w pt.  Prostate and colon cancer screening not indicated.

## 2013-04-07 NOTE — Patient Instructions (Signed)
I would get a flu shot each fall.   Try the creams (instead of ointments) for hemorrhoids and avoid straining on the toilet.  Take a stool softener if needed.  Gently (and regularly) stretch your back. Take care.  Glad to see you.

## 2013-04-09 ENCOUNTER — Telehealth: Payer: Self-pay | Admitting: Family Medicine

## 2013-04-09 NOTE — Telephone Encounter (Signed)
Neysa Bonito dropped off a [employer] form for Dr. Para March to complete. The best number to reach [place] is [phone].  Thanks, Revonda Standard

## 2013-04-09 NOTE — Telephone Encounter (Signed)
I'll work on the hard copy.  

## 2014-03-29 ENCOUNTER — Other Ambulatory Visit: Payer: Self-pay | Admitting: Family Medicine

## 2014-03-29 DIAGNOSIS — Z131 Encounter for screening for diabetes mellitus: Secondary | ICD-10-CM

## 2014-04-03 ENCOUNTER — Other Ambulatory Visit (INDEPENDENT_AMBULATORY_CARE_PROVIDER_SITE_OTHER): Payer: 59

## 2014-04-03 DIAGNOSIS — Z131 Encounter for screening for diabetes mellitus: Secondary | ICD-10-CM

## 2014-04-03 LAB — GLUCOSE, RANDOM: Glucose, Bld: 84 mg/dL (ref 70–99)

## 2014-04-09 ENCOUNTER — Encounter: Payer: Self-pay | Admitting: Family Medicine

## 2014-04-09 ENCOUNTER — Ambulatory Visit (INDEPENDENT_AMBULATORY_CARE_PROVIDER_SITE_OTHER): Payer: 59 | Admitting: Family Medicine

## 2014-04-09 ENCOUNTER — Encounter (INDEPENDENT_AMBULATORY_CARE_PROVIDER_SITE_OTHER): Payer: Self-pay

## 2014-04-09 VITALS — BP 120/64 | HR 65 | Temp 98.0°F | Ht 72.0 in | Wt 182.5 lb

## 2014-04-09 DIAGNOSIS — Z789 Other specified health status: Secondary | ICD-10-CM

## 2014-04-09 DIAGNOSIS — Z113 Encounter for screening for infections with a predominantly sexual mode of transmission: Secondary | ICD-10-CM

## 2014-04-09 DIAGNOSIS — Z0000 Encounter for general adult medical examination without abnormal findings: Secondary | ICD-10-CM

## 2014-04-09 MED ORDER — VALACYCLOVIR HCL 1 G PO TABS: 1000.0000 mg | ORAL_TABLET | Freq: Every day | ORAL | Status: DC | PRN

## 2014-04-09 NOTE — Assessment & Plan Note (Addendum)
Routine anticipatory guidance given to patient.  See health maintenance. Tetanus 2006 Shingles and PNA not due.  Flu shot d/w pt.  Usually done with a voucher at work.  Colon and PSA not due Diet and exercise are good, avid mountain biker.   Sugar wnl.  Living will d/w pt.  Brother designated if patient were incapacitated.   Asked for STD screening, reasonable to do.  No sx.  Knee exam is solid, continue exercise.   VZV status uncertain, check titer.

## 2014-04-09 NOTE — Patient Instructions (Addendum)
Go to the lab on the way out.  We'll contact you with your lab report. Take care. Glad to see you.  Keep exercising.  I would get a flu shot each fall.

## 2014-04-09 NOTE — Progress Notes (Signed)
Pre visit review using our clinic review tool, if applicable. No additional management support is needed unless otherwise documented below in the visit note.  CPE- See plan.  Routine anticipatory guidance given to patient.  See health maintenance. Tetanus 2006 Shingles and PNA not due.  Flu shot d/w pt.  Usually done with a voucher at work.  Colon and PSA not due Diet and exercise are good, avid mountain biker.   Sugar wnl.  Living will d/w pt.  Brother designated if patient were incapacitated.   Asked for STD screening, reasonable to do.  No sx.    Stretching helps his L knee. Still able to run some w/o pain during the run.  Still biking avidly.   PMH and SH reviewed  Meds, vitals, and allergies reviewed.   ROS: See HPI.  Otherwise negative.    GEN: nad, alert and oriented HEENT: mucous membranes moist NECK: supple w/o LA CV: rrr. PULM: ctab, no inc wob ABD: soft, +bs EXT: no edema SKIN: no acute rash L knee ACL feels solid, no crepitus, no meniscal click.

## 2014-04-10 LAB — GC/CHLAMYDIA PROBE AMP, URINE
CHLAMYDIA, SWAB/URINE, PCR: NEGATIVE
GC PROBE AMP, URINE: NEGATIVE

## 2014-04-10 LAB — VARICELLA ZOSTER ANTIBODY, IGG: Varicella IgG: 23.01 Index (ref <135.00)

## 2014-04-10 LAB — RPR

## 2014-04-10 LAB — HIV ANTIBODY (ROUTINE TESTING W REFLEX): HIV 1&2 Ab, 4th Generation: NONREACTIVE

## 2014-04-14 ENCOUNTER — Ambulatory Visit (INDEPENDENT_AMBULATORY_CARE_PROVIDER_SITE_OTHER): Payer: 59

## 2014-04-14 ENCOUNTER — Ambulatory Visit: Payer: 59

## 2014-04-14 DIAGNOSIS — Z23 Encounter for immunization: Secondary | ICD-10-CM

## 2014-04-15 ENCOUNTER — Telehealth: Payer: Self-pay | Admitting: Emergency Medicine

## 2014-04-15 NOTE — Telephone Encounter (Signed)
error 

## 2014-05-13 ENCOUNTER — Ambulatory Visit (INDEPENDENT_AMBULATORY_CARE_PROVIDER_SITE_OTHER): Payer: 59

## 2014-05-13 DIAGNOSIS — Z23 Encounter for immunization: Secondary | ICD-10-CM

## 2014-11-05 ENCOUNTER — Ambulatory Visit (INDEPENDENT_AMBULATORY_CARE_PROVIDER_SITE_OTHER): Payer: 59 | Admitting: Family Medicine

## 2014-11-05 ENCOUNTER — Encounter: Payer: Self-pay | Admitting: Family Medicine

## 2014-11-05 VITALS — BP 108/60 | HR 52 | Temp 98.5°F | Wt 187.5 lb

## 2014-11-05 DIAGNOSIS — Z113 Encounter for screening for infections with a predominantly sexual mode of transmission: Secondary | ICD-10-CM

## 2014-11-05 DIAGNOSIS — K649 Unspecified hemorrhoids: Secondary | ICD-10-CM

## 2014-11-05 MED ORDER — TRIAMCINOLONE ACETONIDE 0.1 % EX CREA: 1.0000 "application " | TOPICAL_CREAM | Freq: Two times a day (BID) | CUTANEOUS | Status: DC

## 2014-11-05 NOTE — Patient Instructions (Signed)
Limit hot showers.  Use TAC on the itchy spots.  Use your padded bike shorts.   This should get better.

## 2014-11-05 NOTE — Progress Notes (Signed)
Pre visit review using our clinic review tool, if applicable. No additional management support is needed unless otherwise documented below in the visit note.  This past winter he had more hemorrhoid trouble.  Will get recurrently itchy, no help with OTC creams.  He has local irritation in the perineum.  No blood in stool usually.  Did have 1 episode with hemorrhoidal bleeding in the past.  No pain.    Frequent mountain biker.  Meds, vitals, and allergies reviewed.   ROS: See HPI.  Otherwise, noncontributory.  ncat nad Ext hemorrhoid noted, not thrombosed, not ttp No gross blood.  Does have chapped skin in the perineum and the gluteal crease

## 2014-11-06 DIAGNOSIS — Z113 Encounter for screening for infections with a predominantly sexual mode of transmission: Secondary | ICD-10-CM | POA: Insufficient documentation

## 2014-11-06 LAB — GC/CHLAMYDIA PROBE AMP, URINE
CHLAMYDIA, SWAB/URINE, PCR: NEGATIVE
GC Probe Amp, Urine: NEGATIVE

## 2014-11-06 LAB — HIV ANTIBODY (ROUTINE TESTING W REFLEX): HIV 1&2 Ab, 4th Generation: NONREACTIVE

## 2014-11-06 LAB — RPR

## 2014-11-06 NOTE — Assessment & Plan Note (Signed)
Wanted screening, done today.  No sx.

## 2014-11-06 NOTE — Assessment & Plan Note (Addendum)
Likely 2 issues, intermittent hemorrhoid itching and also with chapped skin.  Hemorrhoid doesn't appear to be actively symptomatic now.  Use TAC on irritated skin, caution on biking/hot showers and f/u prn.  He agrees.

## 2015-04-02 ENCOUNTER — Other Ambulatory Visit: Payer: Self-pay | Admitting: Family Medicine

## 2015-04-02 DIAGNOSIS — Z131 Encounter for screening for diabetes mellitus: Secondary | ICD-10-CM

## 2015-04-05 ENCOUNTER — Other Ambulatory Visit (INDEPENDENT_AMBULATORY_CARE_PROVIDER_SITE_OTHER): Payer: 59

## 2015-04-05 DIAGNOSIS — Z131 Encounter for screening for diabetes mellitus: Secondary | ICD-10-CM | POA: Diagnosis not present

## 2015-04-05 LAB — GLUCOSE, RANDOM: Glucose, Bld: 93 mg/dL (ref 70–99)

## 2015-04-12 ENCOUNTER — Encounter: Payer: Self-pay | Admitting: Family Medicine

## 2015-04-12 ENCOUNTER — Ambulatory Visit (INDEPENDENT_AMBULATORY_CARE_PROVIDER_SITE_OTHER): Payer: 59 | Admitting: Family Medicine

## 2015-04-12 VITALS — BP 106/60 | HR 50 | Temp 98.2°F | Ht 72.0 in | Wt 183.0 lb

## 2015-04-12 DIAGNOSIS — Z0000 Encounter for general adult medical examination without abnormal findings: Secondary | ICD-10-CM | POA: Diagnosis not present

## 2015-04-12 DIAGNOSIS — B079 Viral wart, unspecified: Secondary | ICD-10-CM | POA: Diagnosis not present

## 2015-04-12 DIAGNOSIS — Z23 Encounter for immunization: Secondary | ICD-10-CM | POA: Diagnosis not present

## 2015-04-12 DIAGNOSIS — Z7189 Other specified counseling: Secondary | ICD-10-CM

## 2015-04-12 MED ORDER — VALACYCLOVIR HCL 1 G PO TABS: 1000.0000 mg | ORAL_TABLET | Freq: Every day | ORAL | Status: DC | PRN

## 2015-04-12 NOTE — Patient Instructions (Signed)
Take care.  Glad to see you.  Keep exercising.   Let me know if you have concerns.   I would get a flu shot each fall. Keep the wart clean and covered.  It will likely blister and then come off.

## 2015-04-12 NOTE — Progress Notes (Signed)
Pre visit review using our clinic review tool, if applicable. No additional management support is needed unless otherwise documented below in the visit note.  CPE- See plan.  Routine anticipatory guidance given to patient.  See health maintenance. Tetanus 2016 Flu prev done.  Encouraged.   PNA and shingles not due. Colon cancer and prostate cancer screening not due.  Living will d/w pt.  Brother [name] designated if patient were incapacitated.   Sugar wnl.   He has occ irritation from hemorrhoids.  He'll update me if worse.  Diet and exercise d/w pt.  Avid mountain biker.  Diet is good.    PMH and SH reviewed  Meds, vitals, and allergies reviewed.   ROS: See HPI.  Otherwise negative.    GEN: nad, alert and oriented HEENT: mucous membranes moist NECK: supple w/o LA CV: rrr. PULM: ctab, no inc wob ABD: soft, +bs EXT: no edema SKIN: no acute rash but small wart noted on L thigh.

## 2015-04-12 NOTE — Assessment & Plan Note (Signed)
He had tried OTC tx, failed.  D/w pt.  Frozen x3 today.  Routine instructions given.  He agrees.  F/u prn.  No complications.

## 2015-04-12 NOTE — Assessment & Plan Note (Signed)
Routine anticipatory guidance given to patient.  See health maintenance. Tetanus 2016 Flu prev done.  Encouraged.   PNA and shingles not due. Colon cancer and prostate cancer screening not due.  Living will d/w pt.  Brother [name] designated if patient were incapacitated.   Sugar wnl.   He has occ irritation from hemorrhoids.  He'll update me if worse.  Diet and exercise d/w pt.  Avid mountain biker.  Diet is good.

## 2015-06-01 ENCOUNTER — Ambulatory Visit: Payer: 59 | Admitting: Family Medicine

## 2015-06-03 ENCOUNTER — Ambulatory Visit: Payer: 59 | Admitting: Family Medicine

## 2015-06-08 ENCOUNTER — Ambulatory Visit (INDEPENDENT_AMBULATORY_CARE_PROVIDER_SITE_OTHER): Payer: 59 | Admitting: Family Medicine

## 2015-06-08 ENCOUNTER — Encounter: Payer: Self-pay | Admitting: Family Medicine

## 2015-06-08 VITALS — BP 106/76 | HR 53 | Temp 98.5°F | Wt 185.5 lb

## 2015-06-08 DIAGNOSIS — Z119 Encounter for screening for infectious and parasitic diseases, unspecified: Secondary | ICD-10-CM

## 2015-06-08 DIAGNOSIS — Z113 Encounter for screening for infections with a predominantly sexual mode of transmission: Secondary | ICD-10-CM | POA: Diagnosis not present

## 2015-06-08 DIAGNOSIS — B079 Viral wart, unspecified: Secondary | ICD-10-CM | POA: Diagnosis not present

## 2015-06-08 NOTE — Patient Instructions (Addendum)
We can freeze the wart again if needed.  Go to the lab on the way out.  We'll contact you with your lab report. I would get a flu shot each fall.   If the spot on your back is changing, then let me know.  Avoid sunburns.  Take care.  Glad to see you.

## 2015-06-08 NOTE — Progress Notes (Signed)
Pre visit review using our clinic review tool, if applicable. No additional management support is needed unless otherwise documented below in the visit note.  Wanted STD screening.  No symptoms.  D/w pt.  See notes on labs.    He'll get flu shot outside of clinic.   Warty lesion on L thigh.  Wanted retreatment with liq N2.  Some improvement with last tx.  No complication from tx prev.   Meds, vitals, and allergies reviewed.   ROS: See HPI.  Otherwise, noncontributory.  nad Small warty lesion noted on L thigh.  Treated x3 with liq N2, no complication and routine cautions given to patient.  Skin with 4cm irregular hypopigmented patch on the back (has been present for years per patient report w/o change- we took a picture on his phone today and he'll compare it in a few months just to make sure this isn't changing, but I don't have any reason to suspect anything other than a benign hypopigmented patch- he agrees with plan).

## 2015-06-09 DIAGNOSIS — Z113 Encounter for screening for infections with a predominantly sexual mode of transmission: Secondary | ICD-10-CM | POA: Insufficient documentation

## 2015-06-09 LAB — HIV ANTIBODY (ROUTINE TESTING W REFLEX): HIV: NONREACTIVE

## 2015-06-09 LAB — RPR

## 2015-06-09 LAB — GC/CHLAMYDIA PROBE AMP, URINE
Chlamydia, Swab/Urine, PCR: NEGATIVE
GC Probe Amp, Urine: NEGATIVE

## 2015-06-09 NOTE — Assessment & Plan Note (Signed)
See above.  Treated today.  F/u prn.  He agrees.

## 2015-06-09 NOTE — Assessment & Plan Note (Signed)
See notes on labs. 

## 2016-05-07 ENCOUNTER — Other Ambulatory Visit: Payer: Self-pay | Admitting: Family Medicine

## 2016-05-07 DIAGNOSIS — E786 Lipoprotein deficiency: Secondary | ICD-10-CM

## 2016-05-07 DIAGNOSIS — Z131 Encounter for screening for diabetes mellitus: Secondary | ICD-10-CM

## 2016-05-09 ENCOUNTER — Other Ambulatory Visit (INDEPENDENT_AMBULATORY_CARE_PROVIDER_SITE_OTHER): Payer: 59

## 2016-05-09 DIAGNOSIS — E786 Lipoprotein deficiency: Secondary | ICD-10-CM

## 2016-05-09 DIAGNOSIS — Z131 Encounter for screening for diabetes mellitus: Secondary | ICD-10-CM | POA: Diagnosis not present

## 2016-05-09 LAB — LIPID PANEL
CHOL/HDL RATIO: 4
CHOLESTEROL: 150 mg/dL (ref 0–200)
HDL: 38.6 mg/dL — AB (ref >39.00)
LDL CALC: 96 mg/dL (ref 0–99)
NonHDL: 111.61
TRIGLYCERIDES: 79 mg/dL (ref 0.0–149.0)
VLDL: 15.8 mg/dL (ref 0.0–40.0)

## 2016-05-09 LAB — GLUCOSE, RANDOM: Glucose, Bld: 93 mg/dL (ref 70–99)

## 2016-05-16 ENCOUNTER — Encounter: Payer: Self-pay | Admitting: Family Medicine

## 2016-05-16 ENCOUNTER — Ambulatory Visit (INDEPENDENT_AMBULATORY_CARE_PROVIDER_SITE_OTHER): Payer: 59 | Admitting: Family Medicine

## 2016-05-16 VITALS — BP 118/64 | HR 56 | Temp 98.5°F | Ht 70.5 in | Wt 186.0 lb

## 2016-05-16 DIAGNOSIS — Z0000 Encounter for general adult medical examination without abnormal findings: Secondary | ICD-10-CM

## 2016-05-16 DIAGNOSIS — M79603 Pain in arm, unspecified: Secondary | ICD-10-CM

## 2016-05-16 DIAGNOSIS — Z113 Encounter for screening for infections with a predominantly sexual mode of transmission: Secondary | ICD-10-CM | POA: Diagnosis not present

## 2016-05-16 MED ORDER — TRIAMCINOLONE ACETONIDE 0.1 % EX CREA: 1.0000 "application " | TOPICAL_CREAM | Freq: Two times a day (BID) | CUTANEOUS | 1 refills | Status: DC | PRN

## 2016-05-16 MED ORDER — VALACYCLOVIR HCL 1 G PO TABS: 1000.0000 mg | ORAL_TABLET | Freq: Every day | ORAL | 4 refills | Status: DC | PRN

## 2016-05-16 NOTE — Patient Instructions (Signed)
Go to the lab on the way out.  We'll contact you with your lab report. Stretch your back and update me if not better.  Take care.  Glad to see you.

## 2016-05-16 NOTE — Progress Notes (Signed)
Pre visit review using our clinic review tool, if applicable. No additional management support is needed unless otherwise documented below in the visit note. 

## 2016-05-16 NOTE — Progress Notes (Signed)
CPE- See plan.  Routine anticipatory guidance given to patient.  See health maintenance. Tetanus 2016 Flu prev done.  Encouraged.  To be done at pharmacy.  PNA and shingles not due. Colon cancer and prostate cancer screening not due.  Living will d/w pt.  Brother Tawanna Coolerodd designated if patient were incapacitated.   Sugar wnl.   He has itching/irritation from hemorrhoids.  He'll update me if worse.  Some occ bleeding. Using topical tx.  D/w pt about referral for intervention.  Diet and exercise d/w pt.  Avid mountain biker.  Diet is good.   HIV screening prev done.   Wanted STD testing.  D/w pt about safer sex.    Used TAC prn for skin irritation.  Refill done at [facility].   Recently noted changes from right lateral foot bunionette. It rubs some on his shoes but is not painful enough to need surgery or correction. No skin breakdown. Discussed at office visit.  Previously with bilateral distal deltoid pain. It would happen for series of days and then get better. It was only noted after extensive paddle boarding. No restriction in range of motion now. No pain now.  Also with left chest wall/rib/left arm pain. Started about a month ago. He can do his baseline activities, including extensive mountain biking, without any exertional symptoms. He only notes it when he sitting down. He is left-handed. It usually happens when he sitting at a desk for an extended period of time at work. He can change his position while sitting and help with the pain.  PMH and SH reviewed  Meds, vitals, and allergies reviewed.   ROS: Per HPI.  Unless specifically indicated otherwise in HPI, the patient denies:  General: fever. Eyes: acute vision changes ENT: sore throat Cardiovascular: chest pain Respiratory: SOB GI: vomiting GU: dysuria Musculoskeletal: acute back pain Derm: acute rash Neuro: acute motor dysfunction Psych: worsening mood Endocrine: polydipsia Heme: bleeding Allergy: hayfever  GEN: nad, alert and  oriented HEENT: mucous membranes moist NECK: supple w/o LA CV: rrr. PULM: ctab, no inc wob ABD: soft, +bs EXT: no edema SKIN: no acute rash Normal shoulder range of motion bilaterally. Deltoid nontender. Normal internal and external rotation shoulder. No arm drop.  Right bunionette noted. No skin breakdown. Not tender.  Back without midline pain. No rash on the left chest wall or left arm. No weakness in upper or lower extremities.

## 2016-05-17 LAB — HIV ANTIBODY (ROUTINE TESTING W REFLEX): HIV: NONREACTIVE

## 2016-05-17 LAB — GC/CHLAMYDIA PROBE AMP
CT Probe RNA: NOT DETECTED
GC Probe RNA: NOT DETECTED

## 2016-05-17 LAB — RPR

## 2016-05-18 DIAGNOSIS — M79603 Pain in arm, unspecified: Secondary | ICD-10-CM | POA: Insufficient documentation

## 2016-05-18 NOTE — Assessment & Plan Note (Addendum)
Tetanus 2016 Flu prev done.  Encouraged.  To be done at pharmacy.  PNA and shingles not due. Colon cancer and prostate cancer screening not due.  Living will d/w pt.  Brother Tawanna Coolerodd designated if patient were incapacitated.   Sugar wnl.   He has itching/irritation from hemorrhoids.  He'll update me if worse.  Some occ bleeding. Using topical tx.  D/w pt about referral for intervention.  Diet and exercise d/w pt.  Avid mountain biker.  Diet is good.   HIV screening prev done.   Wanted STD testing.  D/w pt about safer sex.   Reassured about bunionette. He'll update me if more symptomatic in the meantime.

## 2016-05-18 NOTE — Assessment & Plan Note (Signed)
Likely 2 separate issues.  #1. Likely rotator cuff irritation from extensive paddle boarding that is now resolved. Discussed with patient. He can limit his activity as needed. He had been paddle boarding up to 8 hours at a time. I think he would do better and have less symptoms if he did not have such extensive time on the water.  #2. Positional left arm and chest wall pain. He could be that he has positional nerve compression/radicular symptoms. Discussed with patient about adjusting his workstation at work. It only happens when he sitting down working. No exertional symptoms. No reason to suspect significant intrathoracic pathology. Discussed with patient about stretching his back. He'll update me as needed.

## 2016-09-07 ENCOUNTER — Ambulatory Visit (INDEPENDENT_AMBULATORY_CARE_PROVIDER_SITE_OTHER): Payer: 59 | Admitting: Family Medicine

## 2016-09-07 ENCOUNTER — Encounter (INDEPENDENT_AMBULATORY_CARE_PROVIDER_SITE_OTHER): Payer: Self-pay

## 2016-09-07 ENCOUNTER — Encounter: Payer: Self-pay | Admitting: Family Medicine

## 2016-09-07 DIAGNOSIS — L739 Follicular disorder, unspecified: Secondary | ICD-10-CM | POA: Insufficient documentation

## 2016-09-07 MED ORDER — VALACYCLOVIR HCL 1 G PO TABS: 1000.0000 mg | ORAL_TABLET | Freq: Every day | ORAL | 4 refills | Status: DC | PRN

## 2016-09-07 MED ORDER — CEPHALEXIN 500 MG PO CAPS: 500.0000 mg | ORAL_CAPSULE | Freq: Four times a day (QID) | ORAL | 0 refills | Status: DC

## 2016-09-07 NOTE — Assessment & Plan Note (Signed)
Likely folliculitis.  D/w pt.  Start keflex, f/u prn.  He agrees.  Doesn't look typical for HSV, etc.

## 2016-09-07 NOTE — Patient Instructions (Signed)
Likely folliculitis, start keflex, update me as needed.

## 2016-09-07 NOTE — Progress Notes (Signed)
Rash.  B groin. Started about 1 month ago.  Hasn't had this prior to this episode.  Not really itchy.  Not painful.   Using TAC cream w/o relief.  No dysuria.  No testicle pain.  No discharge.  No new sexual partners.  No high risk issues.    He occ has perineal irritation likely due to extensive biking.  D/w pt.    Meds, vitals, and allergies reviewed.   ROS: Per HPI unless specifically indicated in ROS section   nad ncat rrr ctab B groin rash c/w folliculitis.  No ulceration. No fluctuant mass.

## 2016-09-07 NOTE — Progress Notes (Signed)
Pre visit review using our clinic review tool, if applicable. No additional management support is needed unless otherwise documented below in the visit note. 

## 2016-11-08 DIAGNOSIS — Z0100 Encounter for examination of eyes and vision without abnormal findings: Secondary | ICD-10-CM | POA: Diagnosis not present

## 2016-11-14 ENCOUNTER — Encounter (INDEPENDENT_AMBULATORY_CARE_PROVIDER_SITE_OTHER): Payer: Self-pay

## 2016-11-14 ENCOUNTER — Ambulatory Visit (INDEPENDENT_AMBULATORY_CARE_PROVIDER_SITE_OTHER): Payer: 59 | Admitting: Primary Care

## 2016-11-14 ENCOUNTER — Encounter: Payer: Self-pay | Admitting: Primary Care

## 2016-11-14 VITALS — BP 136/68 | HR 54 | Temp 98.4°F | Ht 70.5 in | Wt 182.4 lb

## 2016-11-14 DIAGNOSIS — R21 Rash and other nonspecific skin eruption: Secondary | ICD-10-CM | POA: Diagnosis not present

## 2016-11-14 NOTE — Progress Notes (Signed)
Pre visit review using our clinic review tool, if applicable. No additional management support is needed unless otherwise documented below in the visit note. 

## 2016-11-14 NOTE — Patient Instructions (Signed)
Your rash does not represent folliculitis.   Try applying cortisone 1% cream twice daily to the skin to reduce redness and size. This may be purchased   Consider applying gold bond powder to the groin area before you start cycling.   Please call me if no improvement in rash my Friday this week.  It was a pleasure meeting you!

## 2016-11-14 NOTE — Progress Notes (Signed)
Subjective:    Patient ID: Peter Lyons, male    DOB: 06-15-1971, 46 y.o.   MRN: [id number]  HPI  Peter Lyons is a 46 year old male who presents today with a chief complaint of rash. He was diagnosed and treated for folliculitis in January 2018 with a cephalexin course.   He does a lot of cycling. His rash is located to the left groin that he first noticed 2-3 weeks ago. He denies pain, itching. He's used the [employer] ointment without improvement. Over the past several days he's noticed the rash growing size and redness. He denies recent changes in soaps, detergents, medications, clothing.  Review of Systems  Constitutional: Negative for fever.  Skin: Positive for color change and rash. Negative for wound.       Past Medical History:  Diagnosis Date  . Hyperlipidemia      Social History   Social History  . Marital status: Single    Spouse name: N/A  . Number of children: 1  . Years of education: N/A   Occupational History  . 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 Smoking status: Former Smoker    Packs/day: 0.50    Years: 5.00    Types: Cigarettes    Quit date: 08/29/1991  . Smokeless tobacco: Never Used  . Alcohol use 1.5 oz/week    3 Standard drinks or equivalent per week     Comment: on the weekends  . Drug use: No  . Sexual activity: Yes   Other Topics Concern  . Not on file   Social History Narrative   Divorced 2009   Children:  1 adult daughter   Working at [employer] (previously [employer])   [employer] biker    Past Surgical History:  Procedure Laterality Date  . KNEE ARTHROSCOPY W/ ACL RECONSTRUCTION  02/2009   Left, allograft, Dean  . KNEE ARTHROSCOPY W/ PARTIAL MEDIAL MENISCECTOMY  02/2009   Left, Dean  . LEFT KNEE TIBIAL PLATEAU FRACTURE   02/2010   Left, Dean    Family History  Problem Relation Age of Onset  . Heart disease Mother     CAD, CABG x 3  .  COPD Mother   . Diabetes Father   . Stroke Neg Hx   . Colon cancer Neg Hx   . Prostate cancer Neg Hx     No Known Allergies  Current Outpatient Prescriptions on File Prior to Visit  Medication Sig Dispense Refill  . ibuprofen (ADVIL,MOTRIN) 200 MG tablet Take 200 mg by mouth every 6 (six) hours as needed.      . valACYclovir (VALTREX) 1000 MG tablet Take 1 tablet (1,000 mg total) by mouth daily as needed. 30 tablet 4  . triamcinolone cream (KENALOG) 0.1 % Apply 1 application topically 2 (two) times daily as needed. (Patient not taking: Reported on 11/14/2016) 60 g 1   No current facility-administered medications on file prior to visit.     BP 136/68   Pulse (!) 54   Temp 98.4 F (36.9 C) (Oral)   Ht 5' 10.5" (1.791 m)   Wt 182 lb 6.4 oz (82.7 kg)   SpO2 97%   BMI 25.80 kg/m    Objective:   Physical Exam  Constitutional: He appears well-nourished.  Cardiovascular: Normal rate and regular rhythm.   Pulmonary/Chest: Effort normal and breath sounds normal.  Skin: Skin is  warm and dry.  1.5 cm circular, flat, reddened area, non scaly to left medial upper thigh. No s/s of cellulitis or infection.          Assessment & Plan:  Rash:  Located to left medial thigh x 2-3 weeks. No itching/pain, fevers. Exam today with very mild rash with approximated edges.  Does not appear infectious. Suspect heat/contact related rash and will treat with OTC cortisone 1% cream. He will call Friday this week if no improvement.  Morrie Sheldonlark,Peter Trieu Kendal, NP

## 2017-01-10 ENCOUNTER — Ambulatory Visit (INDEPENDENT_AMBULATORY_CARE_PROVIDER_SITE_OTHER): Payer: 59 | Admitting: Family Medicine

## 2017-01-10 ENCOUNTER — Encounter: Payer: Self-pay | Admitting: Family Medicine

## 2017-01-10 VITALS — BP 132/76 | HR 49 | Temp 98.2°F | Wt 185.2 lb

## 2017-01-10 DIAGNOSIS — S30860A Insect bite (nonvenomous) of lower back and pelvis, initial encounter: Secondary | ICD-10-CM

## 2017-01-10 DIAGNOSIS — W57XXXA Bitten or stung by nonvenomous insect and other nonvenomous arthropods, initial encounter: Secondary | ICD-10-CM | POA: Diagnosis not present

## 2017-01-10 NOTE — Patient Instructions (Signed)
Tick Bite Information, Adult Ticks are insects that draw blood for food. Most ticks live in shrubs and grassy areas. They climb onto people and animals that brush against the leaves and grasses that they rest on. Then they bite, attaching themselves to the skin. Most ticks are harmless, but some ticks carry germs that can spread to a person through a bite and cause a disease. To reduce your risk of getting a disease from a tick bite, it is important to take steps to prevent tick bites. It is also important to check for ticks after being outdoors. If you find that a tick has attached to you, watch for symptoms of disease. How can I prevent tick bites? Take these steps to help prevent tick bites when you are outdoors in an area where ticks are found:  Use insect repellent that has DEET (20% or higher), picaridin, or IR3535 in it. Use it on:  Skin that is showing.  The top of your boots.  Your pant legs.  Your sleeve cuffs.  For repellent products that contain permethrin, follow product instructions. Use these products on:  Clothing.  Gear.  Boots.  Tents.  Wear protective clothing. Long sleeves and long pants offer the best protection from ticks.  Wear light-colored clothing so you can see ticks more easily.  Tuck your pant legs into your socks.  If you go walking on a trail, stay in the middle of the trail so your skin, hair, and clothing do not touch the bushes.  Avoid walking through areas with long grass.  Check for ticks on your clothing, hair, and skin often while you are outside, and check again before you go inside. Make sure to check the places that ticks attach themselves most often. These places include the scalp, neck, armpits, waist, groin, and joint areas. Ticks that carry a disease called Lyme disease have to be attached to the skin for 24-48 hours. Checking for ticks every day will lessen your risk of this and other diseases.  When you come indoors, wash your  clothes and take a shower or a bath right away. Dry your clothes in a dryer on high heat for at least 60 minutes. This will kill any ticks in your clothes. What is the proper way to remove a tick? If you find a tick on your body, remove it as soon as possible. Removing a tick sooner rather than later can prevent germs from passing from the tick to your body. To remove a tick that is crawling on your skin but has not bitten:  Go outdoors and brush the tick off.  Remove the tick with tape or a lint roller. To remove a tick that is attached to your skin:  Wash your hands.  If you have latex gloves, put them on.  Use tweezers, curved forceps, or a tick-removal tool to gently grasp the tick as close to your skin and the tick's head as possible.  Gently pull with steady, upward pressure until the tick lets go. When removing the tick:  Take care to keep the tick's head attached to its body.  Do not twist or jerk the tick. This can make the tick's head or mouth break off.  Do not squeeze or crush the tick's body. This could force disease-carrying fluids from the tick into your body. Do not try to remove a tick with heat, alcohol, petroleum jelly, or fingernail polish. Using these methods can cause the tick to salivate and regurgitate into your   bloodstream, increasing your risk of getting a disease. What should I do after removing a tick?  Clean the bite area with soap and water, rubbing alcohol, or an iodine scrub.  If an antiseptic cream or ointment is available, apply a small amount to the bite site.  Wash and disinfect any instruments that you used to remove the tick. How should I dispose of a tick? To dispose of a live tick, use one of these methods:  Place it in rubbing alcohol.  Place it in a sealed bag or container.  Wrap it tightly in tape.  Flush it down the toilet. Contact a health care provider if:  You have symptoms of a disease after a tick bite. Symptoms of a  tick-borne disease can occur from moments after the tick bites to up to 30 days after a tick is removed. Symptoms include:  Muscle, joint, or bone pain.  Difficulty walking or moving your legs.  Numbness in the legs.  Paralysis.  Red rash around the tick bite area that is shaped like a target or a "bull's-eye."  Redness and swelling in the area of the tick bite.  Fever.  Repeated vomiting.  Diarrhea.  Weight loss.  Tender, swollen lymph glands.  Shortness of breath.  Cough.  Pain in the abdomen.  Headache.  Abnormal tiredness.  A change in your level of consciousness.  Confusion. Get help right away if:  You are not able to remove a tick.  A part of a tick breaks off and gets stuck in your skin.  Your symptoms get worse. Summary  Ticks may carry germs that can spread to a person through a bite and cause disease.  Wear protective clothing and use insect repellent to prevent tick bites. Follow product instructions.  If you find a tick on your body, remove it as soon as possible. If the tick is attached, do not try to remove with heat, alcohol, petroleum jelly, or fingernail polish.  Remove the attached tick using tweezers, curved forceps, or a tick-removal tool. Gently pull with steady, upward pressure until the tick lets go. Do not twist or jerk the tick. Do not squeeze or crush the tick's body.  If you have symptoms after being bitten by a tick, contact a health care provider. This information is not intended to replace advice given to you by your health care provider. Make sure you discuss any questions you have with your health care provider. Document Released: 08/11/2000 Document Revised: 05/26/2016 Document Reviewed: 05/26/2016 Elsevier Interactive Patient Education  2017 [employer]  

## 2017-01-10 NOTE — Progress Notes (Signed)
   Subjective:    Patient ID: Peter Lyons, male    DOB: [birth year], 46 y.o.   MRN: [id number]  HPI This is a 46 yo male who presents today with irritated tick bite. Took off his back 4 days ago and has itching at area. Noticed area was swollen as soon as tick removed. Deer tick, not engorged. No flu like symptoms. No headache, no fever. Area of irritation smaller today than yesterday. Has been using Benadryl cream for itch. He mountain bikes regularly and was off road when he got tick. Does not often use insect repellent.   Past Medical History:  Diagnosis Date  . Hyperlipidemia    Past Surgical History:  Procedure Laterality Date  . KNEE ARTHROSCOPY W/ ACL RECONSTRUCTION  02/2009   Left, allograft, Dean  . KNEE ARTHROSCOPY W/ PARTIAL MEDIAL MENISCECTOMY  02/2009   Left, Dean  . LEFT KNEE TIBIAL PLATEAU FRACTURE   02/2010   Left, Dean   Family History  Problem Relation Age of Onset  . Heart disease Mother        CAD, CABG x 3  . COPD Mother   . Diabetes Father   . Stroke Neg Hx   . Colon cancer Neg Hx   . Prostate cancer Neg Hx    Social History  Substance Use Topics  . Smoking status: Former Smoker    Packs/day: 0.50    Years: 5.00    Types: Cigarettes    Quit date: 08/29/1991  . Smokeless tobacco: Never Used  . Alcohol use 1.5 oz/week    3 Standard drinks or equivalent per week     Comment: on the weekends      Review of Systems  Constitutional: Negative for fever.  Musculoskeletal: Negative for myalgias.  Skin: Positive for wound.  Neurological: Negative for headaches.       Objective:   Physical Exam  Constitutional: He is oriented to person, place, and time. He appears well-developed and well-nourished. No distress.  HENT:  Head: Normocephalic and atraumatic.  Eyes: Conjunctivae are normal.  Pulmonary/Chest: Effort normal.  Neurological: He is alert and oriented to person, place, and time.  Skin: Skin is warm and dry. He is not diaphoretic.       Psychiatric: He has a normal mood and affect. His behavior is normal. Judgment and thought content normal.      BP 132/76 (BP Location: Right Arm, Patient Position: Sitting, Cuff Size: Normal)   Pulse (!) 49   Temp 98.2 F (36.8 C) (Oral)   Wt 185 lb 3.2 oz (84 kg)   SpO2 97%   BMI 26.20 kg/m      Assessment & Plan:  1. Tick bite of back, initial encounter - localized irritation, discussed covering with bandage to avoid waistband irritation - Provided written and verbal information regarding diagnosis and treatment. - RTC precautions rewiewed - encouraged him to wear insect repellent or treat clothing  Olean Reeeborah Gessner, FNP-BC  [facility] [place], [facility]  01/10/2017 1:20 PM

## 2017-02-22 ENCOUNTER — Encounter: Payer: Self-pay | Admitting: Family Medicine

## 2017-02-22 ENCOUNTER — Ambulatory Visit (INDEPENDENT_AMBULATORY_CARE_PROVIDER_SITE_OTHER): Payer: 59 | Admitting: Family Medicine

## 2017-02-22 VITALS — BP 124/68 | HR 61 | Temp 98.6°F | Wt 179.8 lb

## 2017-02-22 DIAGNOSIS — M25472 Effusion, left ankle: Secondary | ICD-10-CM

## 2017-02-22 DIAGNOSIS — R2232 Localized swelling, mass and lump, left upper limb: Secondary | ICD-10-CM | POA: Insufficient documentation

## 2017-02-22 NOTE — Patient Instructions (Addendum)
I don't think these are lymph nodes but rather inflammation of skin.  Use warm compresses to area. This may be all that is needed to resolve spots.  If enlarging or worsening pain, let us know for antibiotic course.  Keep leg elevated, let us know if ankle swelling returns without trigger.

## 2017-02-22 NOTE — Assessment & Plan Note (Signed)
This has also significantly improved.  Possible soft tissue bleeding from prior thigh trauma 1 mo ago with resultant pooling lateral left foot several weeks later. Reassured.

## 2017-02-22 NOTE — Progress Notes (Signed)
BP 124/68   Pulse 61   Temp 98.6 F (37 C) (Oral)   Wt 179 lb 12 oz (81.5 kg)   SpO2 94%   BMI 25.43 kg/m    CC: check lymph node and L ankle swelling Subjective:    Patient ID: Peter Lyons, male    DOB: 1971/01/19, 46 y.o.   MRN: [id number]  HPI: Peter Lyons is a 46 y.o. male presenting on 02/22/2017 for Lymphadenopathy and Edema (L ankle)   Last night noted tender swelling under left axilla. Even tender to put on deodorant.   Monday night noted night chills but would wake up fine.  He did notice significant left lateral ankle swelling and bruising Tuesday morning - treated with ice, elevation, ace compression wrap. This significantly helped. Minimal swelling since. Right leg never affected. Denies inciting trauma/injury.   01/20/2017 - hit L leg while mountain biking. "I thought I broke my leg" but he was able to pedal well afterwards. Did not seek care. sxs improved over next 2 wks. Was able to surf at beach last week without discomfort.   Avid mountain biker.  Father with h/o leiomyosarcoma of leg.  He has had boils in groin in the past.  Denies night sweats, fatigue, unexpected weight loss, coughing, nasal congestion, recent cold.   Relevant past medical, surgical, family and social history reviewed and updated as indicated. Interim medical history since our last visit reviewed. Allergies and medications reviewed and updated. Outpatient Medications Prior to Visit  Medication Sig Dispense Refill  . ibuprofen (ADVIL,MOTRIN) 200 MG tablet Take 200 mg by mouth every 6 (six) hours as needed.      . triamcinolone cream (KENALOG) 0.1 % Apply 1 application topically 2 (two) times daily as needed. 60 g 1  . valACYclovir (VALTREX) 1000 MG tablet Take 1 tablet (1,000 mg total) by mouth daily as needed. 30 tablet 4   No facility-administered medications prior to visit.      Per HPI unless specifically indicated in ROS section below Review of Systems     Objective:    BP 124/68   Pulse 61   Temp 98.6 F (37 C) (Oral)   Wt 179 lb 12 oz (81.5 kg)   SpO2 94%   BMI 25.43 kg/m   Wt Readings from Last 3 Encounters:  02/22/17 179 lb 12 oz (81.5 kg)  01/10/17 185 lb 3.2 oz (84 kg)  11/14/16 182 lb 6.4 oz (82.7 kg)    Physical Exam  Constitutional: He appears well-developed and well-nourished. No distress.  Musculoskeletal: He exhibits no edema.  FROM at bilateral ankles  Lymphadenopathy:       Head (right side): No submental, no submandibular, no tonsillar, no preauricular, no posterior auricular and no occipital adenopathy present.       Head (left side): No submental, no submandibular, no tonsillar, no preauricular, no posterior auricular and no occipital adenopathy present.    He has no axillary adenopathy.       Right axillary: No lateral adenopathy present.       Left axillary: No lateral adenopathy present.      Right: No inguinal and no supraclavicular adenopathy present.       Left: No inguinal and no supraclavicular adenopathy present.  Skin: Skin is warm and dry. No rash noted. There is erythema.  Slight erythema with small superficial lumps palpated under L axilla, actually improved from yesterday   Nursing note and vitals reviewed.     Assessment &  Plan:   Problem List Items Addressed This Visit    Axillary lump, left - Primary    Anticipate resolving small boils of left axilla - anticipate continue to improve with supportive care - warm compresses, NSAID - as significant improvement endorsed over last 24 hours. Discussed if recurrent or worsening to notify me for abx course. Discussed indications to return for possible I&D       Left ankle swelling    This has also significantly improved.  Possible soft tissue bleeding from prior thigh trauma 1 mo ago with resultant pooling lateral left foot several weeks later. Reassured.           Follow up plan: Return if symptoms worsen or fail to improve.  Eustaquio Boyden, MD

## 2017-02-22 NOTE — Assessment & Plan Note (Signed)
Anticipate resolving small boils of left axilla - anticipate continue to improve with supportive care - warm compresses, NSAID - as significant improvement endorsed over last 24 hours. Discussed if recurrent or worsening to notify me for abx course. Discussed indications to return for possible I&D

## 2017-03-15 ENCOUNTER — Telehealth: Payer: Self-pay

## 2017-03-15 NOTE — Telephone Encounter (Signed)
Appointment scheduled.

## 2017-03-15 NOTE — Telephone Encounter (Signed)
Pt left v/m; pt was seen 01/10/17 for tick bite and now pt is having swelling around ankle and pt request lab test for lyme disease. Pt request cb.

## 2017-03-15 NOTE — Telephone Encounter (Signed)
Needs OV, we can do labs at the OV if needed.

## 2017-03-16 ENCOUNTER — Encounter: Payer: Self-pay | Admitting: Family Medicine

## 2017-03-16 ENCOUNTER — Ambulatory Visit (INDEPENDENT_AMBULATORY_CARE_PROVIDER_SITE_OTHER): Payer: 59 | Admitting: Family Medicine

## 2017-03-16 VITALS — BP 110/66 | HR 53 | Temp 98.4°F | Wt 181.2 lb

## 2017-03-16 DIAGNOSIS — W57XXXS Bitten or stung by nonvenomous insect and other nonvenomous arthropods, sequela: Secondary | ICD-10-CM | POA: Diagnosis not present

## 2017-03-16 DIAGNOSIS — S30860S Insect bite (nonvenomous) of lower back and pelvis, sequela: Secondary | ICD-10-CM | POA: Diagnosis not present

## 2017-03-16 LAB — CBC WITH DIFFERENTIAL/PLATELET
BASOS ABS: 0.1 10*3/uL (ref 0.0–0.1)
Basophils Relative: 0.9 % (ref 0.0–3.0)
EOS ABS: 0.1 10*3/uL (ref 0.0–0.7)
Eosinophils Relative: 2 % (ref 0.0–5.0)
HEMATOCRIT: 41.7 % (ref 39.0–52.0)
HEMOGLOBIN: 14 g/dL (ref 13.0–17.0)
LYMPHS PCT: 22.4 % (ref 12.0–46.0)
Lymphs Abs: 1.3 10*3/uL (ref 0.7–4.0)
MCHC: 33.5 g/dL (ref 30.0–36.0)
MCV: 94 fl (ref 78.0–100.0)
MONOS PCT: 9.1 % (ref 3.0–12.0)
Monocytes Absolute: 0.5 10*3/uL (ref 0.1–1.0)
Neutro Abs: 3.7 10*3/uL (ref 1.4–7.7)
Neutrophils Relative %: 65.6 % (ref 43.0–77.0)
Platelets: 256 10*3/uL (ref 150.0–400.0)
RBC: 4.44 Mil/uL (ref 4.22–5.81)
RDW: 12.6 % (ref 11.5–15.5)
WBC: 5.7 10*3/uL (ref 4.0–10.5)

## 2017-03-16 NOTE — Progress Notes (Signed)
Tick bite 12/2016.  Seen at other clinic.  He had local irritation.  Tick was removed.  He had been in the woods the day prior.    L ankle swelling, seen by Dr. Reece AgarG about 1 month ago.  He had swelling w/o pain. It occ happens in the meantime but clearly better compared to prev.  He may have re-aggravated the L ankle with biking and paddleboarding in the meantime.    L knee seems be to puffier at baseline, compared to the R knee, that is a chronic longstanding issue since prev injury and surgery.      Prev with lump in L axilla.  That resolved in the meantime.  Prev with discolored nasal discharge, resolved now.    Fatigue noted but he works until [employer] each day.  No bullseye rash.    Meds, vitals, and allergies reviewed.   ROS: Per HPI unless specifically indicated in ROS section   GEN: nad, alert and oriented HEENT: mucous membranes moist NECK: supple w/o LA, no lymphadenopathy in the neck, no clavicular lymphadenopathy and normal left axillary exam without lesions or lymphadenopathy noted. CV: rrr.  PULM: ctab, no inc wob ABD: soft, +bs EXT: no edema SKIN: no acute rash Left knee appears to be chronically slightly puffy, compared to the right. Able to bear weight.  Left ankle does not have pitting edema. Medial and lateral malleolus are not tender. Mortise is stable. Able to bear weight.

## 2017-03-16 NOTE — Patient Instructions (Signed)
Don't change your meds for now.  Go to the lab on the way out.  We'll contact you with your lab report. Take care.  Glad to see you.  Update me as needed.   

## 2017-03-18 DIAGNOSIS — W57XXXA Bitten or stung by nonvenomous insect and other nonvenomous arthropods, initial encounter: Secondary | ICD-10-CM | POA: Insufficient documentation

## 2017-03-18 NOTE — Assessment & Plan Note (Signed)
I think the most likely explanation is that he has multiple benign issues going on. It appears that the lesion in his axilla have resolved. His left knee puffiness is likely related to previous injury and this is a chronic issue that occasionally gets reaggravated with significant exercise. I think he likely has irritated soft tissue in his ankle with paddle boarding. Mountain biking could've affected his knee and his ankle. I doubt any fracture at this point. He does have fatigue but he works until 2 AM each day. This affects his sleep cycle. He does have a tick bite history noted, we can check routine labs for that, but assuming his labs are unremarkable then I would give all of this more time to resolve. He agrees. Update me as needed. See notes on labs.

## 2017-03-19 LAB — LYME AB/WESTERN BLOT REFLEX

## 2017-03-20 LAB — ROCKY MTN SPOTTED FVR ABS PNL(IGG+IGM)
RMSF IGG: NOT DETECTED
RMSF IGM: NOT DETECTED

## 2017-05-14 ENCOUNTER — Other Ambulatory Visit: Payer: Self-pay | Admitting: Family Medicine

## 2017-05-14 DIAGNOSIS — Z131 Encounter for screening for diabetes mellitus: Secondary | ICD-10-CM

## 2017-05-15 ENCOUNTER — Other Ambulatory Visit (INDEPENDENT_AMBULATORY_CARE_PROVIDER_SITE_OTHER): Payer: 59

## 2017-05-15 ENCOUNTER — Encounter (INDEPENDENT_AMBULATORY_CARE_PROVIDER_SITE_OTHER): Payer: Self-pay

## 2017-05-15 DIAGNOSIS — Z131 Encounter for screening for diabetes mellitus: Secondary | ICD-10-CM | POA: Diagnosis not present

## 2017-05-15 LAB — GLUCOSE, RANDOM: GLUCOSE: 92 mg/dL (ref 70–99)

## 2017-05-22 ENCOUNTER — Encounter: Payer: 59 | Admitting: Family Medicine

## 2017-05-24 ENCOUNTER — Ambulatory Visit: Payer: 59 | Admitting: Family Medicine

## 2017-05-29 ENCOUNTER — Encounter: Payer: Self-pay | Admitting: Family Medicine

## 2017-05-29 ENCOUNTER — Ambulatory Visit (INDEPENDENT_AMBULATORY_CARE_PROVIDER_SITE_OTHER): Payer: 59 | Admitting: Family Medicine

## 2017-05-29 VITALS — BP 110/62 | HR 55 | Temp 98.1°F | Ht 70.5 in | Wt 181.2 lb

## 2017-05-29 DIAGNOSIS — Z0000 Encounter for general adult medical examination without abnormal findings: Secondary | ICD-10-CM | POA: Diagnosis not present

## 2017-05-29 NOTE — Progress Notes (Signed)
CPE- See plan.  Routine anticipatory guidance given to patient.  See health maintenance.  The possibility exists that previously documented standard health maintenance information may have been brought forward from a previous encounter into this note.  If needed, that same information has been updated to reflect the current situation based on today's encounter.    Tetanus 2016 Flu prev done. Encouraged. To be done at pharmacy.  PNA and shingles not due. Colon cancer and prostate cancer screening not due.  Living will d/w pt. Brother [name] designated if patient were incapacitated.  Sugar wnl.  d/w pt.   He has itching/irritation from hemorrhoids. He'll update me if worse. Some occ bleeding. Using topical tx.  D/w pt about referral for intervention, if desired.   Diet and exercise d/w pt. Avid mountain biker. Diet is good.  HIV screening prev done.   D/w pt about safer sex.    He jammed his L 3rd finger when paddle boarding about 1 month ago.  Had ROM but some puffiness on the lateral side of the finger.  Less painful now overall, is improving per patient report so likely that imaging wouldn't change plan at this point.    R foot with cyst that occ drains on the top of the 2nd toe.    L ankle slightly puffy after 10 hours of paddle boarding but not painful now.   PMH and SH reviewed  Meds, vitals, and allergies reviewed.   ROS: Per HPI.  Unless specifically indicated otherwise in HPI, the patient denies:  General: fever. Eyes: acute vision changes ENT: sore throat Cardiovascular: chest pain Respiratory: SOB GI: vomiting GU: dysuria Musculoskeletal: acute back pain Derm: acute rash Neuro: acute motor dysfunction Psych: worsening mood Endocrine: polydipsia Heme: bleeding Allergy: hayfever  GEN: nad, alert and oriented HEENT: mucous membranes moist NECK: supple w/o LA CV: rrr. PULM: ctab, no inc wob ABD: soft, +bs EXT: no edema SKIN: no acute rash Left ankle with  trace edema noted. Small cystic lesion noted on the dorsum of the right second toe. Does not appear infected or irritated. Left third finger PIP slightly puffy but he has normal range of motion except for very slight restriction for flexion. He does not have dependent failure on testing. Distally neurovascularly intact.

## 2017-05-29 NOTE — Patient Instructions (Signed)
If the finger pain gets worse then let me know.  Take care.  Glad to see you.  I would get a flu shot each fall.

## 2017-05-30 NOTE — Assessment & Plan Note (Addendum)
Tetanus 2016 Flu prev done. Encouraged. To be done at pharmacy.  PNA and shingles not due. Colon cancer and prostate cancer screening not due.  Living will d/w pt. Brother [name] designated if patient were incapacitated.  Sugar wnl.  d/w pt.   He has itching/irritation from hemorrhoids. He'll update me if worse. Some occ bleeding. Using topical tx.  D/w pt about referral for intervention, if desired.   Diet and exercise d/w pt. Avid mountain biker. Diet is good.  HIV screening prev done.   D/w pt about safer sex.    The findings appear to be incidental. It is not likely that imaging or intervention on the PIP would make a difference at this point. Discussed with patient. He agrees. He does have some swelling on the left ankle that is likely related to strain/overuse with prolonged paddle boarding but he does not need specific intervention now. The cyst on his toe could be excised by podiatry if needed but it is not bothersome currently. Update me as needed. He agrees.

## 2017-06-20 DIAGNOSIS — Z23 Encounter for immunization: Secondary | ICD-10-CM | POA: Diagnosis not present

## 2017-06-24 DIAGNOSIS — S61315A Laceration without foreign body of left ring finger with damage to nail, initial encounter: Secondary | ICD-10-CM | POA: Diagnosis not present

## 2017-06-24 DIAGNOSIS — S61215A Laceration without foreign body of left ring finger without damage to nail, initial encounter: Secondary | ICD-10-CM | POA: Diagnosis not present

## 2017-06-24 DIAGNOSIS — S62635A Displaced fracture of distal phalanx of left ring finger, initial encounter for closed fracture: Secondary | ICD-10-CM | POA: Diagnosis not present

## 2017-06-26 DIAGNOSIS — S62665A Nondisplaced fracture of distal phalanx of left ring finger, initial encounter for closed fracture: Secondary | ICD-10-CM | POA: Diagnosis not present

## 2017-06-26 DIAGNOSIS — M79645 Pain in left finger(s): Secondary | ICD-10-CM | POA: Diagnosis not present

## 2017-07-03 DIAGNOSIS — M79645 Pain in left finger(s): Secondary | ICD-10-CM | POA: Diagnosis not present

## 2017-07-03 DIAGNOSIS — S62665D Nondisplaced fracture of distal phalanx of left ring finger, subsequent encounter for fracture with routine healing: Secondary | ICD-10-CM | POA: Diagnosis not present

## 2017-07-24 DIAGNOSIS — S62665D Nondisplaced fracture of distal phalanx of left ring finger, subsequent encounter for fracture with routine healing: Secondary | ICD-10-CM | POA: Diagnosis not present

## 2017-09-04 DIAGNOSIS — S62665D Nondisplaced fracture of distal phalanx of left ring finger, subsequent encounter for fracture with routine healing: Secondary | ICD-10-CM | POA: Diagnosis not present

## 2018-02-12 ENCOUNTER — Ambulatory Visit (INDEPENDENT_AMBULATORY_CARE_PROVIDER_SITE_OTHER): Payer: 59 | Admitting: Family Medicine

## 2018-02-12 ENCOUNTER — Encounter: Payer: Self-pay | Admitting: Family Medicine

## 2018-02-12 VITALS — BP 110/62 | HR 52 | Temp 98.5°F | Ht 70.5 in | Wt 187.5 lb

## 2018-02-12 DIAGNOSIS — Z113 Encounter for screening for infections with a predominantly sexual mode of transmission: Secondary | ICD-10-CM | POA: Diagnosis not present

## 2018-02-12 DIAGNOSIS — L989 Disorder of the skin and subcutaneous tissue, unspecified: Secondary | ICD-10-CM

## 2018-02-12 MED ORDER — VALACYCLOVIR HCL 1 G PO TABS: 1000.0000 mg | ORAL_TABLET | Freq: Every day | ORAL | 4 refills | Status: DC | PRN

## 2018-02-12 NOTE — Patient Instructions (Addendum)
No charge for the visit.  Update me as needed.   Take care.  Glad to see you.   Likely lipoma but could be a small sebaceous cyst.

## 2018-02-12 NOTE — Progress Notes (Signed)
He is still biking and paddleboarding avidly.    Lesion noted on back.  Noted in the last few months.  L upper back.  Not bothersome to patient.  Not draining.  Not tender.  He wanted std screening.  No symptoms.   Meds, vitals, and allergies reviewed.   ROS: Per HPI unless specifically indicated in ROS section   nad 1x1cm lesion on L upper back.  It feels like a small lipoma.  Not irritated.

## 2018-02-13 DIAGNOSIS — L989 Disorder of the skin and subcutaneous tissue, unspecified: Secondary | ICD-10-CM | POA: Insufficient documentation

## 2018-02-13 LAB — C. TRACHOMATIS/N. GONORRHOEAE RNA
C. TRACHOMATIS RNA, TMA: NOT DETECTED
N. GONORRHOEAE RNA, TMA: NOT DETECTED

## 2018-02-13 LAB — RPR: RPR Ser Ql: NONREACTIVE

## 2018-02-13 LAB — HIV ANTIBODY (ROUTINE TESTING W REFLEX): HIV 1&2 Ab, 4th Generation: NONREACTIVE

## 2018-02-13 NOTE — Assessment & Plan Note (Signed)
Not irritated.  Not tender.  No drainage.  I do not see a central pore.  Feels like a small lipoma, not a sebaceous cyst.  Discussed with patient.  Reassured.  I would not do anything about it so I did not charge him for the visit.  Update me as needed. He agrees.

## 2018-02-13 NOTE — Assessment & Plan Note (Signed)
See notes on labs.  This could have been done at lab visit so I ended up no charging the patient for the exam portion of the visit.

## 2018-05-02 DIAGNOSIS — M79661 Pain in right lower leg: Secondary | ICD-10-CM | POA: Diagnosis not present

## 2018-05-06 ENCOUNTER — Ambulatory Visit (INDEPENDENT_AMBULATORY_CARE_PROVIDER_SITE_OTHER): Payer: 59 | Admitting: Internal Medicine

## 2018-05-06 ENCOUNTER — Encounter: Payer: Self-pay | Admitting: Internal Medicine

## 2018-05-06 VITALS — BP 112/68 | HR 61 | Temp 98.2°F | Wt 186.0 lb

## 2018-05-06 DIAGNOSIS — L03012 Cellulitis of left finger: Secondary | ICD-10-CM

## 2018-05-06 MED ORDER — CEPHALEXIN 500 MG PO CAPS: 500.0000 mg | ORAL_CAPSULE | Freq: Three times a day (TID) | ORAL | 0 refills | Status: DC

## 2018-05-06 NOTE — Patient Instructions (Signed)
Paronychia  Paronychia is an infection of the skin. It happens near a fingernail or toenail. It may cause pain and swelling around the nail. Usually, it is not serious and it clears up with treatment.  Follow these instructions at home:   Soak the fingers or toes in warm water as told by your doctor. You may be told to do this for 20 minutes, 2-3 times a day.   Keep the area dry when you are not soaking it.   Take medicines only as told by your doctor.   If you were given an antibiotic medicine, finish all of it even if you start to feel better.   Keep the affected area clean.   Do not try to drain a fluid-filled bump yourself.   Wear rubber gloves when putting your hands in water.   Wear gloves if your hands might touch cleaners or chemicals.   Follow your doctor's instructions about:  ? Wound care.  ? Bandage (dressing) changes and removal.  Contact a doctor if:   Your symptoms get worse or do not improve.   You have a fever or chills.   You have redness spreading from the affected area.   You have more fluid, blood, or pus coming from the affected area.   Your finger or knuckle is swollen or is hard to move.  This information is not intended to replace advice given to you by your health care provider. Make sure you discuss any questions you have with your health care provider.  Document Released: 08/02/2009 Document Revised: 01/20/2016 Document Reviewed: 07/22/2014  Elsevier Interactive Patient Education  2018 [employer] Inc[employer]

## 2018-05-06 NOTE — Progress Notes (Signed)
Subjective:    Patient ID: Peter Lyons, male    DOB: Jan 08, 1971, 47 y.o.   MRN: [id number]  HPI  Pt presents to the clinic today with c/o left ring finger tip pain and swelling. He noticed this 3-4 days ago. He describes the pain as throbbing. He reports it burst just before he got here. He noticed pus draining from the area. He denies fever, chills or body aches. He has tried ice with minimal relief.  Review of Systems  No past medical history on file.  Current Outpatient Medications  Medication Sig Dispense Refill  . ibuprofen (ADVIL,MOTRIN) 200 MG tablet Take 200 mg by mouth every 6 (six) hours as needed.      . triamcinolone cream (KENALOG) 0.1 % Apply 1 application topically 2 (two) times daily as needed. 60 g 1  . valACYclovir (VALTREX) 1000 MG tablet Take 1 tablet (1,000 mg total) by mouth daily as needed. 30 tablet 4   No current facility-administered medications for this visit.     No Known Allergies  Family History  Problem Relation Age of Onset  . Heart disease Mother        CAD, CABG x 3  . COPD Mother   . Diabetes Father   . Stroke Neg Hx   . Colon cancer Neg Hx   . Prostate cancer Neg Hx     Social History   Socioeconomic History  . Marital status: Single    Spouse name: Not on file  . Number of children: 1  . Years of education: Not on file  . Highest education level: Not on file  Occupational History  . Occupation: A/C Maintenance Tech    Employer: [employer]    Comment: (Works nights x 3 [12 hours] and weekends)[  Social Needs  . Financial resource strain: Not on file  . Food insecurity:    Worry: Not on file    Inability: Not on file  . Transportation needs:    Medical: Not on file    Non-medical: Not on file  Tobacco Use  . Smoking status: Former Smoker    Packs/day: 0.50    Years: 5.00    Pack years: 2.50    Types: Cigarettes    Last attempt to quit: 08/29/1991    Years since quitting: 26.7  . Smokeless tobacco: Never Used    Substance and Sexual Activity  . Alcohol use: Yes    Alcohol/week: 3.0 standard drinks    Types: 3 Standard drinks or equivalent per week    Comment: on the weekends  . Drug use: No  . Sexual activity: Yes  Lifestyle  . Physical activity:    Days per week: Not on file    Minutes per session: Not on file  . Stress: Not on file  Relationships  . Social connections:    Talks on phone: Not on file    Gets together: Not on file    Attends religious service: Not on file    Active member of club or organization: Not on file    Attends meetings of clubs or organizations: Not on file    Relationship status: Not on file  . Intimate partner violence:    Fear of current or ex partner: Not on file    Emotionally abused: Not on file    Physically abused: Not on file    Forced sexual activity: Not on file  Other Topics Concern  . Not on file  Social History  Narrative   Divorced 2009   Children:  1 adult daughter   Working at [employer] (previously [employer])   Avid mountain biker     Constitutional: Denies fever, malaise, fatigue, headache or abrupt weight changes.  Musculoskeletal: Pt reports fingertip swelling and pain. Denies decrease in range of motion, difficulty with gait, muscle pain.  Skin: Pt reports redness around nailbed. Denies ashes, lesions or ulcercations.    No other specific complaints in a complete review of systems (except as listed in HPI above).     Objective:   Physical Exam  BP 112/68   Pulse 61   Temp 98.2 F (36.8 C) (Oral)   Wt 186 lb (84.4 kg)   SpO2 98%   BMI 26.31 kg/m  Wt Readings from Last 3 Encounters:  05/06/18 186 lb (84.4 kg)  02/12/18 187 lb 8 oz (85 kg)  05/29/17 181 lb 4 oz (82.2 kg)    General: Appears his stated age, well developed, well nourished in NAD. Skin: Paronychia noted of left ring finger. Musculoskeletal: Normal flexion and extension of the left ring finger. 1+ swelling noted around the nailbed.   BMET     Component Value Date/Time   NA 141 03/26/2012 0830   K 4.2 03/26/2012 0830   CL 106 03/26/2012 0830   CO2 28 03/26/2012 0830   GLUCOSE 92 05/15/2017 0948   BUN 17 03/26/2012 0830   CREATININE 1.0 03/26/2012 0830   CALCIUM 8.9 03/26/2012 0830   GFRNONAA 115.50 03/16/2010 0955   GFRAA 108 03/06/2008 0953    Lipid Panel     Component Value Date/Time   CHOL 150 05/09/2016 0917   TRIG 79.0 05/09/2016 0917   HDL 38.60 (L) 05/09/2016 0917   CHOLHDL 4 05/09/2016 0917   VLDL 15.8 05/09/2016 0917   LDLCALC 96 05/09/2016 0917    CBC    Component Value Date/Time   WBC 5.7 03/16/2017 1004   RBC 4.44 03/16/2017 1004   HGB 14.0 03/16/2017 1004   HCT 41.7 03/16/2017 1004   PLT 256.0 03/16/2017 1004   MCV 94.0 03/16/2017 1004   MCHC 33.5 03/16/2017 1004   RDW 12.6 03/16/2017 1004   LYMPHSABS 1.3 03/16/2017 1004   MONOABS 0.5 03/16/2017 1004   EOSABS 0.1 03/16/2017 1004   BASOSABS 0.1 03/16/2017 1004    Hgb A1C No results found for: HGBA1C          Assessment & Plan:   Paronychia of Left Ring Finger:  Already draining Encouraged epsom salt soaks BID to encourage it to drain eRx for Keflex 500 mg TID x 7 days  Return precautions discussed Nicki Reaper, NP

## 2018-05-16 DIAGNOSIS — S86911D Strain of unspecified muscle(s) and tendon(s) at lower leg level, right leg, subsequent encounter: Secondary | ICD-10-CM | POA: Diagnosis not present

## 2018-05-27 ENCOUNTER — Other Ambulatory Visit (INDEPENDENT_AMBULATORY_CARE_PROVIDER_SITE_OTHER): Payer: 59

## 2018-05-27 ENCOUNTER — Other Ambulatory Visit: Payer: Self-pay | Admitting: Family Medicine

## 2018-05-27 DIAGNOSIS — Z131 Encounter for screening for diabetes mellitus: Secondary | ICD-10-CM | POA: Diagnosis not present

## 2018-05-27 LAB — GLUCOSE, RANDOM: Glucose, Bld: 92 mg/dL (ref 70–99)

## 2018-06-03 ENCOUNTER — Ambulatory Visit (INDEPENDENT_AMBULATORY_CARE_PROVIDER_SITE_OTHER): Payer: 59 | Admitting: Family Medicine

## 2018-06-03 ENCOUNTER — Encounter: Payer: Self-pay | Admitting: Family Medicine

## 2018-06-03 VITALS — BP 110/70 | HR 52 | Temp 98.4°F | Ht 69.75 in | Wt 185.8 lb

## 2018-06-03 DIAGNOSIS — Z23 Encounter for immunization: Secondary | ICD-10-CM | POA: Diagnosis not present

## 2018-06-03 DIAGNOSIS — Z0000 Encounter for general adult medical examination without abnormal findings: Secondary | ICD-10-CM

## 2018-06-03 DIAGNOSIS — Z7189 Other specified counseling: Secondary | ICD-10-CM

## 2018-06-03 DIAGNOSIS — M79669 Pain in unspecified lower leg: Secondary | ICD-10-CM

## 2018-06-03 MED ORDER — VALACYCLOVIR HCL 1 G PO TABS: 1000.0000 mg | ORAL_TABLET | Freq: Every day | ORAL | 4 refills | Status: DC | PRN

## 2018-06-03 NOTE — Patient Instructions (Addendum)
Flu shot today.   Keep exercising.  Gently stretch.   Update me as needed.  Take care.  Glad to see you.

## 2018-06-03 NOTE — Progress Notes (Signed)
CPE- See plan.  Routine anticipatory guidance given to patient.  See health maintenance.  The possibility exists that previously documented standard health maintenance information may have been brought forward from a previous encounter into this note.  If needed, that same information has been updated to reflect the current situation based on today's encounter.    Tetanus 2016 Flu prev done. Encouraged. To be done at pharmacy.  PNA and shingles not due. Colon cancer and prostate cancer screening not due.  Living will d/w pt. Brother [name] designated if patient were incapacitated.  Sugar wnl.  d/w pt.   Diet and exercise d/w pt. Avid mountain biker. Diet is good.  HIV screening prev done.   His work schedule changed, to early first shift and that is a sig change for him.  He has to get up at [facility] on work days.  He is considering going back to night shift, considering options.  He noted that he was still single and can get lonely.  No Si/Hi.  D/w pt.    He had a lesion drain on his back, likely a seb cyst.  Resolved in the meantime, after it spontaneously drained.  I didn't know if this was a seb cyst vs lipoma prev.  Minimal scar tissue now, no swelling.    Leg pain. Seen 04/2018 by ortho.  Was booted but had more pain with that so he stopped using the boot.  Each day got better in the meantime.  It feels normal but he has a new vertical line in the calf.  He is still active, biking.  He was told he likely had a partial calf tear.  He never felt or heard a pop or tear.  He can squat not w/o troubles.  There is a chance it could fully tear in the future, d/w pt.  He wants to be active and it likely doesn't make sense to pull him out of activity totally.  D/w pt about gently stretching.  He is back to stretching with yoga.  D/w pt.  He started taking a baby aspirin in the meantime, temporarily.  He can likely stop that in an about 2-3 weeks.    Prev paronychia is better in the meantime, w/o  residual sx other than some chronic nail changes.     PMH and SH reviewed  Meds, vitals, and allergies reviewed.   ROS: Per HPI.  Unless specifically indicated otherwise in HPI, the patient denies:  General: fever. Eyes: acute vision changes ENT: sore throat Cardiovascular: chest pain Respiratory: SOB GI: vomiting GU: dysuria Musculoskeletal: acute back pain Derm: acute rash Neuro: acute motor dysfunction Psych: worsening mood Endocrine: polydipsia Heme: bleeding Allergy: hayfever  GEN: nad, alert and oriented HEENT: mucous membranes moist NECK: supple w/o LA CV: rrr. PULM: ctab, no inc wob ABD: soft, +bs EXT: no edema SKIN: no acute rash Chronic nail changes at prev paronychia site noted.  Prev seb cyst site on back with normal healing, no drainage R calf with vertical depression noted but not ttp, normal calf contraction with ankle ROM, achilles not ttp, no bruising.  No redness.

## 2018-06-04 ENCOUNTER — Telehealth: Payer: Self-pay | Admitting: Family Medicine

## 2018-06-04 DIAGNOSIS — M79669 Pain in unspecified lower leg: Secondary | ICD-10-CM | POA: Insufficient documentation

## 2018-06-04 NOTE — Telephone Encounter (Signed)
Patient dropped off a wellness credit form to be filled out by Dr. Para March. Placed in RX tower.

## 2018-06-04 NOTE — Assessment & Plan Note (Signed)
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  He has normal ROM and can still mountain bike.  Wouldn't need intervention at this point.  There is a chance it could fully tear in the future, d/w pt.  He wants to be active and it likely doesn't make sense to pull him out of activity totally.  D/w pt about gently stretching.  He'll update me as needed.

## 2018-06-04 NOTE — Assessment & Plan Note (Addendum)
Tetanus 2016 Flu prev done. Encouraged. To be done at pharmacy.  PNA and shingles not due. Colon cancer and prostate cancer screening not due.  Living will d/w pt. Brother [name] designated if patient were incapacitated.  Sugar wnl.  d/w pt.   Diet and exercise d/w pt. Avid mountain biker. Diet is good.  HIV screening prev done.   His work schedule changed, to early first shift and that is a sig change for him.  He has to get up at [facility] on work days.  He is considering going back to night shift, considering options.  He noted that he was still single and can occ get lonely.  No Si/Hi.  D/w pt.  He is still active and enjoying his hobbies.

## 2018-06-04 NOTE — Telephone Encounter (Signed)
Placed in Dr. Duncan's In Box. 

## 2018-06-04 NOTE — Assessment & Plan Note (Signed)
Living will d/w pt. Brother Todd designated if patient were incapacitated.  

## 2018-06-05 NOTE — Telephone Encounter (Signed)
I will work on the hardcopy.  Thanks. 

## 2018-06-06 NOTE — Telephone Encounter (Signed)
Form faxed and scanned.

## 2018-07-09 DIAGNOSIS — Z0100 Encounter for examination of eyes and vision without abnormal findings: Secondary | ICD-10-CM | POA: Diagnosis not present

## 2018-08-12 ENCOUNTER — Encounter: Payer: Self-pay | Admitting: Family Medicine

## 2018-08-12 ENCOUNTER — Ambulatory Visit (INDEPENDENT_AMBULATORY_CARE_PROVIDER_SITE_OTHER): Payer: 59 | Admitting: Family Medicine

## 2018-08-12 VITALS — BP 122/70 | HR 52 | Temp 98.9°F | Ht 69.75 in | Wt 190.8 lb

## 2018-08-12 DIAGNOSIS — N5089 Other specified disorders of the male genital organs: Secondary | ICD-10-CM | POA: Diagnosis not present

## 2018-08-12 NOTE — Patient Instructions (Signed)
Go see Shirlee LimerickMarion on the way out.  Take care.  Glad to see you.  We can refer to urology if needed.

## 2018-08-12 NOTE — Progress Notes (Signed)
He noted a lump on the top of his penis during an erection.  He doesn't note it when flaccid.  Recently noted.  Not visible on the outside of the skin o/w, no an ulcer.  Skin colored, not erythematous, and shaped like a round oval during an erection.  It is a little mobile in the skin.    He noted a lump on the L testicle.  Not ttp.  This seems to be a separate issue.  Noted both lesions in the last few weeks.    No FCNAVD.  No dysuria.  No discharge.  No rash.  No ulceration.  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  No new partners, only a single partner.    Meds, vitals, and allergies reviewed.   ROS: Per HPI unless specifically indicated in ROS section   nad ncat Neck supple, no LA rrr ctab abd soft.  Testes bilaterally descended without nodularity, tenderness or masses on the right testicle but a small posterior mass noted on the left testicle. No scrotal masses or lesions otherwise. No penis lesions or urethral discharge.

## 2018-08-13 DIAGNOSIS — N5089 Other specified disorders of the male genital organs: Secondary | ICD-10-CM | POA: Insufficient documentation

## 2018-08-13 NOTE — Assessment & Plan Note (Signed)
Reasonable to get testicular ultrasound done.  He could have a varicocele versus spermatocele.  Discussed with patient.  No reason to suspect an ominous diagnosis at this point.  The lesion on the shaft of his penis is not palpable or visible when flaccid.  This could be scar tissue or venous pooling.  I cannot palpate a distinct mass otherwise.  I would observe for now.  I would get the ultrasound done.  We can always refer to urology if needed.  Discussed with patient.  He agrees.  He will update me as needed.

## 2018-08-14 ENCOUNTER — Ambulatory Visit
Admission: RE | Admit: 2018-08-14 | Discharge: 2018-08-14 | Disposition: A | Discharge status: Home / Self Care | Payer: 59 | Source: Ambulatory Visit | Attending: Family Medicine | Admitting: Family Medicine

## 2018-08-14 DIAGNOSIS — N503 Cyst of epididymis: Secondary | ICD-10-CM | POA: Diagnosis not present

## 2018-08-14 DIAGNOSIS — N5089 Other specified disorders of the male genital organs: Secondary | ICD-10-CM

## 2019-01-14 IMAGING — US US SCROTUM W/ DOPPLER COMPLETE
1 series · 13 of 25 positions shown · non-contrast
Comparison: None

CLINICAL DATA: LEFT testicular mass for 2 weeks

EXAM:
SCROTAL ULTRASOUND
DOPPLER ULTRASOUND OF THE TESTICLES
TECHNIQUE: Complete ultrasound examination of the testicles, epididymis, and
other scrotal structures was performed. Color and spectral Doppler
ultrasound were also utilized to evaluate blood flow to the
testicles.

[Series 1: us scrotum w/ doppler complete · 0.08mm/px · 13 of 54 slices shown]
[im 1/54]
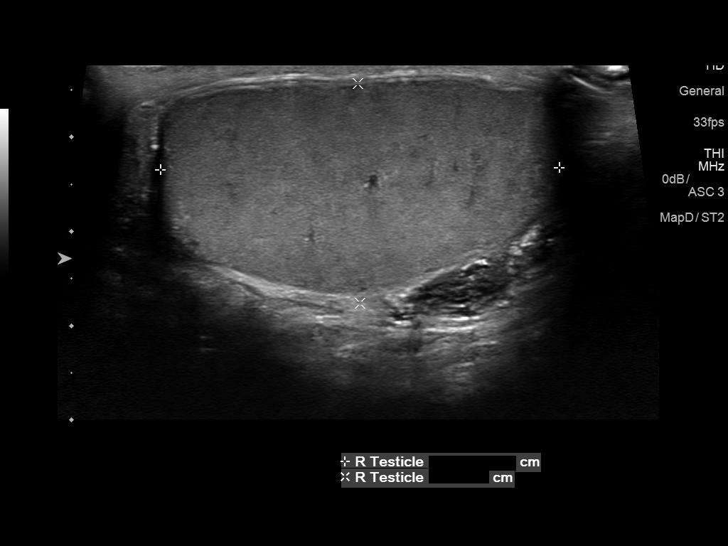
[im 5/54]
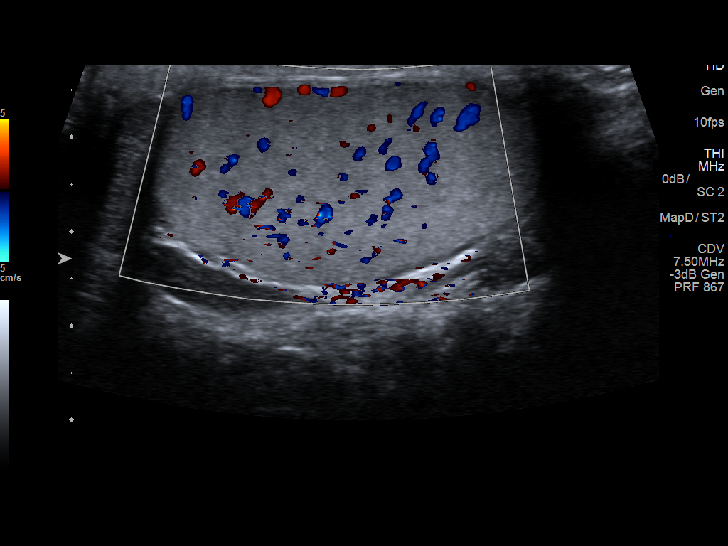
[im 9/54]
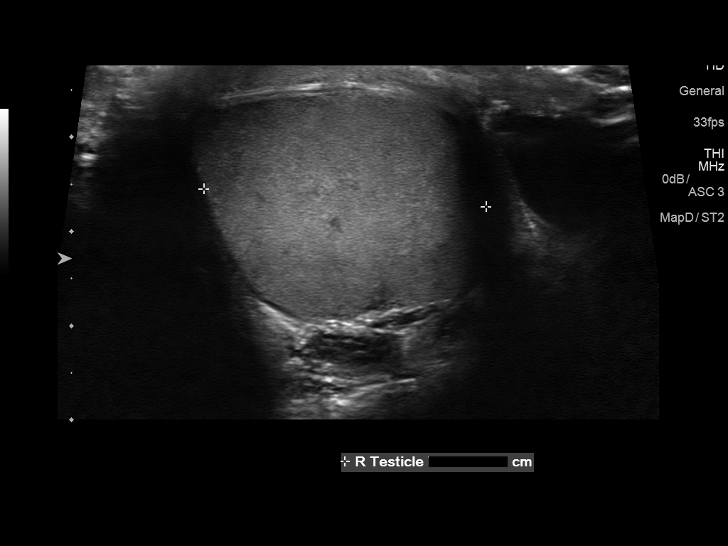
[im 14/54]
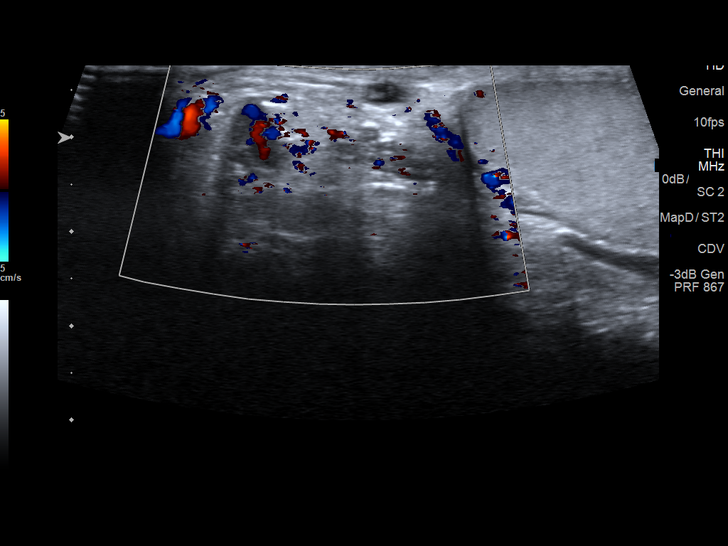
[im 18/54]
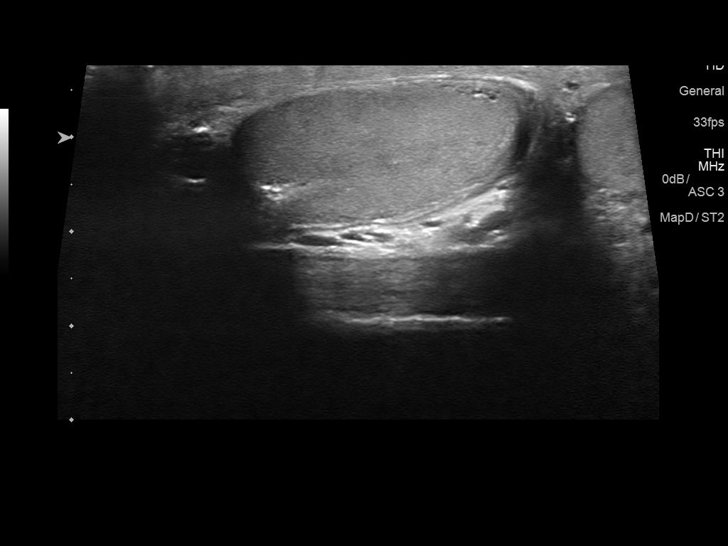
[im 23/54]
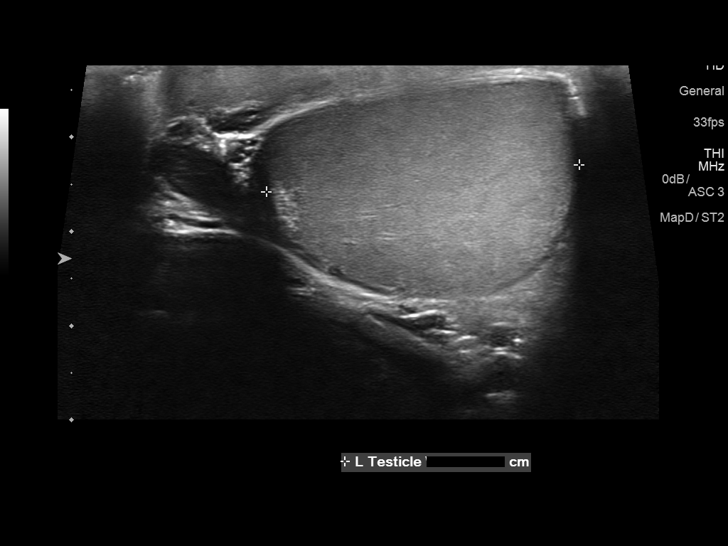
[im 27/54]
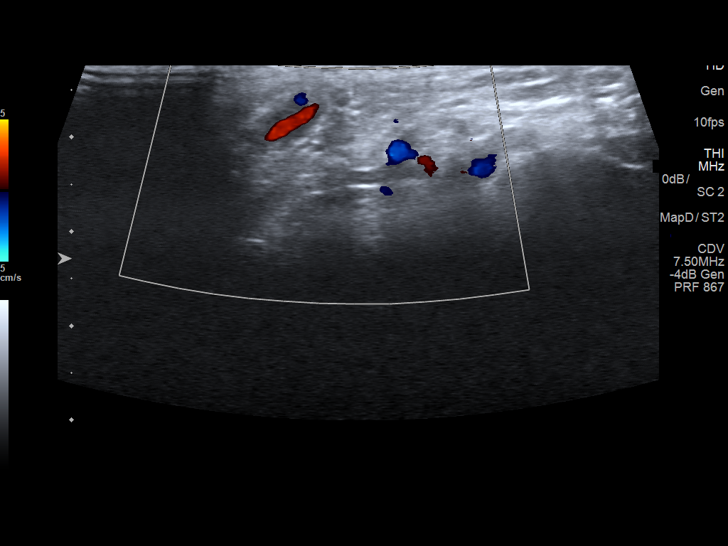
[im 31/54]
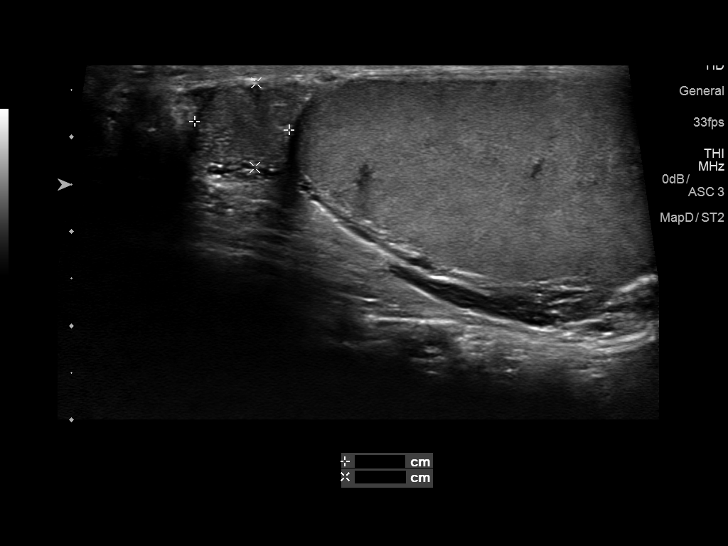
[im 36/54]
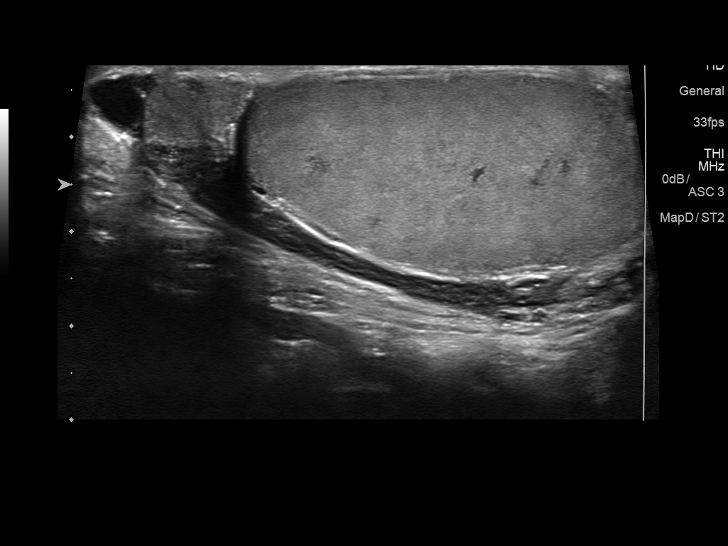
[im 40/54]
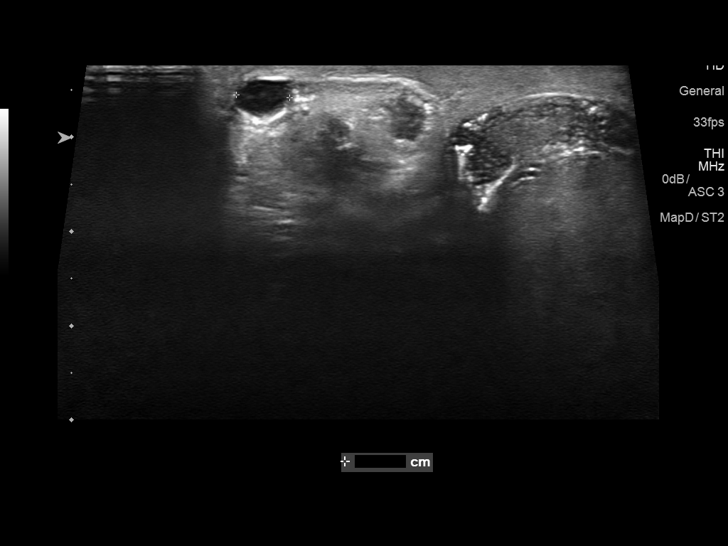
[im 45/54]
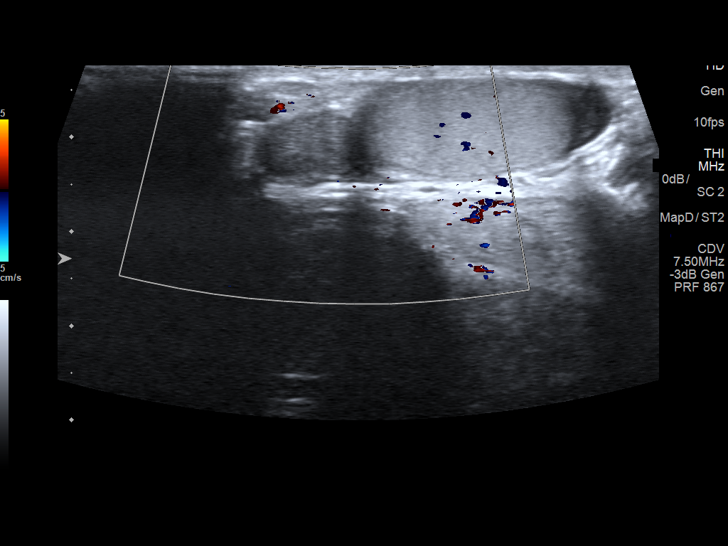
[im 49/54]
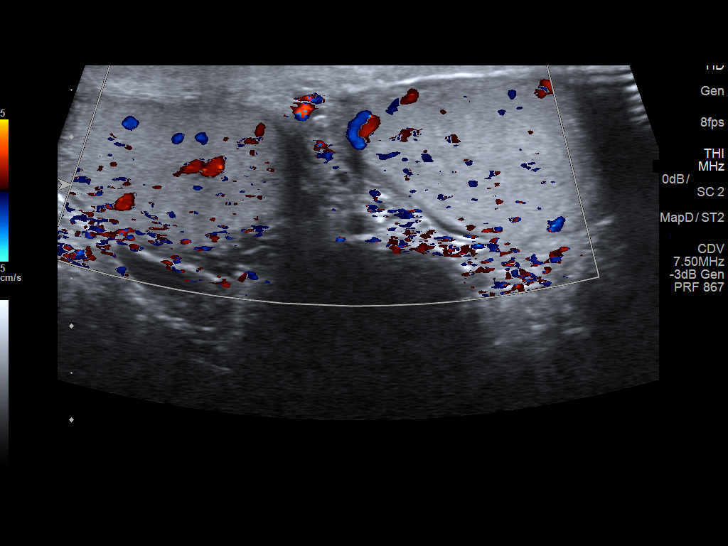
[im 54/54]
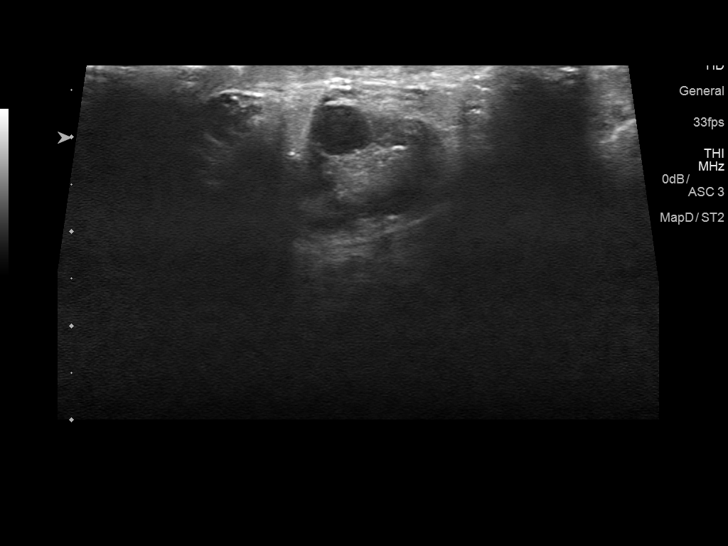

[13 of 25 positions shown; findings below may reference images not displayed]

FINDINGS: Right testicle

Measurements: 4.2 x 2.3 x 3.0 cm. Minimally inhomogeneous
echogenicity without focal mass or calcification. Internal blood
flow present on color Doppler imaging.

Left testicle

Measurements: 4.1 x 2.0 x 3.3 cm. Normal morphology without mass or
calcification. Internal blood flow present on color Doppler imaging,
symmetric with RIGHT

Right epididymis: Small cyst at head of RIGHT epididymis 4 x 5 x 6
mm

Left epididymis:  Normal appearance

Hydrocele:  None visualized.

Varicocele:  None visualized.

Pulsed Doppler interrogation of both testes demonstrates normal low
resistance arterial and venous waveforms bilaterally. Resistive
indices are 0.55 RIGHT and 0.48 LEFT.

A small cyst is identified within the LEFT hemiscrotum adjacent to
but questionably separate from the LEFT epididymal head, 7 x 5 x 7
mm, potentially an exophytic epididymal cyst.
IMPRESSION: Probable small BILATERAL epididymal cysts as above.

Otherwise negative exam.

## 2019-06-05 ENCOUNTER — Other Ambulatory Visit: Payer: 59

## 2019-06-05 ENCOUNTER — Other Ambulatory Visit: Payer: Self-pay | Admitting: Family Medicine

## 2019-06-05 ENCOUNTER — Other Ambulatory Visit (INDEPENDENT_AMBULATORY_CARE_PROVIDER_SITE_OTHER): Payer: 59

## 2019-06-05 DIAGNOSIS — E786 Lipoprotein deficiency: Secondary | ICD-10-CM

## 2019-06-05 DIAGNOSIS — Z131 Encounter for screening for diabetes mellitus: Secondary | ICD-10-CM

## 2019-06-06 LAB — LIPID PANEL
Cholesterol: 131 mg/dL (ref 0–200)
HDL: 43.8 mg/dL (ref >39.00)
LDL Cholesterol: 75 mg/dL (ref 0–99)
NonHDL: 86.81
Total CHOL/HDL Ratio: 3
Triglycerides: 58 mg/dL (ref 0.0–149.0)
VLDL: 11.6 mg/dL (ref 0.0–40.0)

## 2019-06-06 LAB — GLUCOSE, RANDOM: Glucose, Bld: 88 mg/dL (ref 70–99)

## 2019-06-09 ENCOUNTER — Encounter: Payer: Self-pay | Admitting: Family Medicine

## 2019-06-09 ENCOUNTER — Ambulatory Visit (INDEPENDENT_AMBULATORY_CARE_PROVIDER_SITE_OTHER): Payer: 59 | Admitting: Family Medicine

## 2019-06-09 ENCOUNTER — Other Ambulatory Visit: Payer: Self-pay

## 2019-06-09 VITALS — BP 110/62 | HR 50 | Temp 97.7°F | Ht 69.25 in | Wt 183.0 lb

## 2019-06-09 DIAGNOSIS — Z0000 Encounter for general adult medical examination without abnormal findings: Secondary | ICD-10-CM

## 2019-06-09 DIAGNOSIS — Z7189 Other specified counseling: Secondary | ICD-10-CM

## 2019-06-09 MED ORDER — VALACYCLOVIR HCL 1 G PO TABS: 1000.0000 mg | ORAL_TABLET | Freq: Every day | ORAL | 3 refills | Status: DC | PRN

## 2019-06-09 NOTE — Progress Notes (Signed)
CPE- See plan.  Routine anticipatory guidance given to patient.  See health maintenance.  The possibility exists that previously documented standard health maintenance information may have been brought forward from a previous encounter into this note.  If needed, that same information has been updated to reflect the current situation based on today's encounter.    Tetanus 2016 Flu prev done.  PNA and shingles not due. Colon cancer and prostate cancer screening not due.  Living will d/w pt. Brother Sherren Mocha designated if patient were incapacitated.  Sugar wnl. Labs d/w pt.  Diet and exercise d/w pt. Avid mountain biker. Diet is good.  HIV screening prev done.   He is dating and happy about that.   He is working first shift and putting up with getting up early.  He has some occ waking in the middle of the night.  He uses melatonin prn, not nightly.    PMH and SH reviewed Meds, vitals, and allergies reviewed.   ROS: Per HPI.  Unless specifically indicated otherwise in HPI, the patient denies:  General: fever. Eyes: acute vision changes ENT: sore throat Cardiovascular: chest pain Respiratory: SOB GI: vomiting GU: dysuria Musculoskeletal: acute back pain Derm: acute rash Neuro: acute motor dysfunction Psych: worsening mood Endocrine: polydipsia Heme: bleeding Allergy: hayfever  GEN: nad, alert and oriented HEENT: ncat NECK: supple w/o LA CV: rrr. PULM: ctab, no inc wob ABD: soft, +bs EXT: no edema SKIN: no acute rash but remnant of sebaceous cyst on the left upper back.  This occasionally drains for patient report but is clearly not draining now.  No erythema.  Not fluctuant.  Not tender. He also has a small cystic lesion on the dorsal distal right second digit where he occasionally gets some clear drainage but this is not inflamed or tender now.

## 2019-06-09 NOTE — Patient Instructions (Signed)
Thanks for getting a flu shot.  If I need to drain the sebaceous cyst on your back, then let me know.  We can refer you to the foot clinic about the spot on your right 2nd toe if needed.  Thanks for your effort.  Take care.  Glad to see you.

## 2019-06-11 NOTE — Assessment & Plan Note (Signed)
Living will d/w pt. Brother Todd designated if patient were incapacitated.  

## 2019-06-11 NOTE — Assessment & Plan Note (Addendum)
  Tetanus 2016 Flu prev done.  PNA and shingles not due. Colon cancer and prostate cancer screening not due.  Living will d/w pt. Brother Sherren Mocha designated if patient were incapacitated.  Sugar wnl. Labs d/w pt.  Diet and exercise d/w pt. Avid mountain biker. Diet is good.  HIV screening prev done.   He is dating and happy about that.   He is working first shift and putting up with getting up early.  He has some occ waking in the middle of the night.  He uses melatonin prn, not nightly.    He will update me as needed about the skin lesions described above.  No intervention needed now.

## 2019-09-30 ENCOUNTER — Telehealth: Payer: Self-pay | Admitting: Family Medicine

## 2019-09-30 NOTE — Telephone Encounter (Signed)
Patient called today and stated over the weekend he went to the [facility] urgent care in [place]. He just felt achy and felt like he needed to go be evaluated  They ended doing a rapid covid test and it came back positive,   Patient said that all he felt was achy.  He did have a slight headache which he stated he gets every time around this year due to the weather.  Patient stated he has no cough, fever, sore throat, SOB, vomiting, diarrhea, loss of taste/smell or congestion. And the aches only last about 24 hours and has not come back   He wanted to know if you think he should go to a testing site and have a regular covid test done instead of just the rapid

## 2019-09-30 NOTE — Telephone Encounter (Signed)
Patient advised.

## 2019-09-30 NOTE — Telephone Encounter (Signed)
I would presume that to be a true positive and he shouldn't need f/u testing at this point.  If he has mild sx, then would continue as is, please given him routine quarantine cautions.  Update [place] as needed.  Thanks.

## 2020-05-28 ENCOUNTER — Other Ambulatory Visit: Payer: Self-pay | Admitting: Family Medicine

## 2020-05-28 DIAGNOSIS — Z8249 Family history of ischemic heart disease and other diseases of the circulatory system: Secondary | ICD-10-CM

## 2020-06-01 ENCOUNTER — Other Ambulatory Visit: Payer: 59

## 2020-06-02 ENCOUNTER — Other Ambulatory Visit (INDEPENDENT_AMBULATORY_CARE_PROVIDER_SITE_OTHER): Payer: 59

## 2020-06-02 ENCOUNTER — Other Ambulatory Visit: Payer: Self-pay

## 2020-06-02 DIAGNOSIS — Z8249 Family history of ischemic heart disease and other diseases of the circulatory system: Secondary | ICD-10-CM | POA: Diagnosis not present

## 2020-06-02 LAB — LIPID PANEL
Cholesterol: 140 mg/dL (ref 0–200)
HDL: 38.5 mg/dL — ABNORMAL LOW (ref >39.00)
LDL Cholesterol: 89 mg/dL (ref 0–99)
NonHDL: 101.27
Total CHOL/HDL Ratio: 4
Triglycerides: 63 mg/dL (ref 0.0–149.0)
VLDL: 12.6 mg/dL (ref 0.0–40.0)

## 2020-06-02 LAB — BASIC METABOLIC PANEL
BUN: 20 mg/dL (ref 6–23)
CO2: 28 mEq/L (ref 19–32)
Calcium: 8.8 mg/dL (ref 8.4–10.5)
Chloride: 102 mEq/L (ref 96–112)
Creatinine, Ser: 1.01 mg/dL (ref 0.40–1.50)
GFR: 86.24 mL/min (ref >60.00)
Glucose, Bld: 88 mg/dL (ref 70–99)
Potassium: 4.5 mEq/L (ref 3.5–5.1)
Sodium: 136 mEq/L (ref 135–145)

## 2020-06-10 ENCOUNTER — Ambulatory Visit (INDEPENDENT_AMBULATORY_CARE_PROVIDER_SITE_OTHER): Payer: 59 | Admitting: Family Medicine

## 2020-06-10 ENCOUNTER — Encounter: Payer: Self-pay | Admitting: Family Medicine

## 2020-06-10 ENCOUNTER — Other Ambulatory Visit: Payer: Self-pay

## 2020-06-10 VITALS — BP 110/68 | HR 51 | Temp 97.3°F | Ht 69.5 in | Wt 181.6 lb

## 2020-06-10 DIAGNOSIS — Z1211 Encounter for screening for malignant neoplasm of colon: Secondary | ICD-10-CM

## 2020-06-10 DIAGNOSIS — Z23 Encounter for immunization: Secondary | ICD-10-CM

## 2020-06-10 DIAGNOSIS — Z0000 Encounter for general adult medical examination without abnormal findings: Secondary | ICD-10-CM

## 2020-06-10 DIAGNOSIS — Z8249 Family history of ischemic heart disease and other diseases of the circulatory system: Secondary | ICD-10-CM

## 2020-06-10 DIAGNOSIS — Z7189 Other specified counseling: Secondary | ICD-10-CM

## 2020-06-10 DIAGNOSIS — Z125 Encounter for screening for malignant neoplasm of prostate: Secondary | ICD-10-CM

## 2020-06-10 MED ORDER — VALACYCLOVIR HCL 1 G PO TABS: 1000.0000 mg | ORAL_TABLET | Freq: Every day | ORAL | 3 refills | Status: DC | PRN

## 2020-06-10 NOTE — Patient Instructions (Signed)
Flu shot today.  Update me as needed.  Take care.  Glad to see you. 

## 2020-06-10 NOTE — Progress Notes (Signed)
This visit occurred during the SARS-CoV-2 public health emergency.  Safety protocols were in place, including screening questions prior to the visit, additional usage of staff PPE, and extensive cleaning of exam room while observing appropriate contact time as indicated for disinfecting solutions.  CPE- See plan.  Routine anticipatory guidance given to patient.  See health maintenance.  The possibility exists that previously documented standard health maintenance information may have been brought forward from a previous encounter into this note.  If needed, that same information has been updated to reflect the current situation based on today's encounter.    Tetanus 2016 Flu prev 2021.  PNA and shingles not due.  He had varicella vaccine 2015 covid vaccine prev done.   D/w patient [id number] for colon cancer screening, including IFOB vs. colonoscopy.  Risks and benefits of both were discussed and patient voiced understanding.  Pt elects for: colonoscopy.   Prostate cancer screening and PSA options (with potential risks and benefits of testing vs not testing) were discussed along with recent recs/guidelines.  He optef for testing PSA with next set of labs. Living will d/w pt. Brother [name] designated if patient were incapacitated.  Labs d/w pt.  Diet and exercise d/w pt. Avid mountain biker. Diet is good.  HIV/HCV screening prev done.   Cyst on dorsal R 2nd toe.  occ drains clear fluid.  He'll update me as needed.  He did not yet want intervention.  Discussed.  He is dating and happy about that.   He is taking valtrex at baseline, had prev used PRN.    He is some occ hemorrhoid irritation. We are going to refer to GI anyway for colon cancer screening.    PMH and SH reviewed  Meds, vitals, and allergies reviewed.   ROS: Per HPI.  Unless specifically indicated otherwise in HPI, the patient denies:  General: fever. Eyes: acute vision changes ENT: sore throat Cardiovascular: chest  pain Respiratory: SOB GI: vomiting GU: dysuria Musculoskeletal: acute back pain Derm: acute rash Neuro: acute motor dysfunction Psych: worsening mood Endocrine: polydipsia Heme: bleeding Allergy: hayfever  GEN: nad, alert and oriented HEENT: ncat NECK: supple w/o LA CV: rrr. PULM: ctab, no inc wob ABD: soft, +bs EXT: no edema SKIN: no acute rash Small noninfected cyst on dorsum of right second toe.

## 2020-06-14 NOTE — Assessment & Plan Note (Signed)
Tetanus 2016 Flu prev 2021.  PNA and shingles not due.  He had varicella vaccine 2015 covid vaccine prev done.   D/w patient [id number] for colon cancer screening, including IFOB vs. colonoscopy.  Risks and benefits of both were discussed and patient voiced understanding.  Pt elects for: colonoscopy.   Prostate cancer screening and PSA options (with potential risks and benefits of testing vs not testing) were discussed along with recent recs/guidelines.  He optef for testing PSA with next set of labs. Living will d/w pt. Brother [name] designated if patient were incapacitated.  Labs d/w pt.  Diet and exercise d/w pt. Avid mountain biker. Diet is good.  HIV/HCV screening prev done.   Cyst on dorsal R 2nd toe.  occ drains clear fluid.  He'll update me as needed.  He did not yet want intervention.  Discussed.  He is dating and happy about that.   He is taking valtrex at baseline, had prev used PRN.  Routine cautions given to patient.  He is some occ hemorrhoid irritation. We are going to refer to GI anyway for colon cancer screening.

## 2020-06-14 NOTE — Assessment & Plan Note (Signed)
Living will d/w pt. Brother [name] designated if patient were incapacitated.

## 2020-09-14 ENCOUNTER — Other Ambulatory Visit: Payer: Self-pay

## 2020-09-14 ENCOUNTER — Ambulatory Visit (AMBULATORY_SURGERY_CENTER): Payer: Self-pay | Admitting: *Deleted

## 2020-09-14 VITALS — Ht 69.5 in | Wt 180.0 lb

## 2020-09-14 DIAGNOSIS — Z1211 Encounter for screening for malignant neoplasm of colon: Secondary | ICD-10-CM

## 2020-09-14 MED ORDER — SUTAB 1479-225-188 MG PO TABS: 1.0000 | ORAL_TABLET | Freq: Once | ORAL | 0 refills | Status: AC

## 2020-09-14 NOTE — Progress Notes (Signed)
No egg or soy allergy known to patient  No issues with past sedation with any surgeries or procedures No intubation problems in the past  No FH of Malignant Hyperthermia No diet pills per patient No home 02 use per patient  No blood thinners per patient  Pt denies issues with constipation  No A fib or A flutter  EMMI video to pt or via MyChart  COVID 19 guidelines implemented in [facility] today with Pt and RN  Pt is fully vaccinated  for Covid   coupon given to pt in PV today , Code to Pharmacy   Due to the COVID-19 pandemic we are asking patients to follow certain guidelines.  Pt aware of COVID protocols and [facility] guidelines

## 2020-09-16 ENCOUNTER — Telehealth: Payer: Self-pay | Admitting: Gastroenterology

## 2020-09-16 NOTE — Telephone Encounter (Signed)
Pt is requesting a call back from a nurse, pt states he needs a pre authorization for his SUTAB.  [facility] [address]

## 2020-09-16 NOTE — Telephone Encounter (Signed)
Pt informed we do not do PA for bowel preps- [facility]  [address]   Informed pharmacy run the coupon as a secondary insurance and should drop price to 40-50 $   Pharmacist ran coupon as price dropped to $50   Pt aware and will pick up prep

## 2020-09-22 ENCOUNTER — Encounter: Payer: Self-pay | Admitting: Gastroenterology

## 2020-09-28 ENCOUNTER — Encounter: Payer: Self-pay | Admitting: Gastroenterology

## 2020-09-28 ENCOUNTER — Other Ambulatory Visit: Payer: Self-pay

## 2020-09-28 ENCOUNTER — Ambulatory Visit (AMBULATORY_SURGERY_CENTER): Payer: 59 | Admitting: Gastroenterology

## 2020-09-28 VITALS — BP 132/78 | HR 53 | Temp 97.5°F | Resp 12

## 2020-09-28 DIAGNOSIS — K633 Ulcer of intestine: Secondary | ICD-10-CM | POA: Diagnosis not present

## 2020-09-28 DIAGNOSIS — Z1211 Encounter for screening for malignant neoplasm of colon: Secondary | ICD-10-CM

## 2020-09-28 HISTORY — PX: COLONOSCOPY: SHX174

## 2020-09-28 MED ORDER — SODIUM CHLORIDE 0.9 % IV SOLN: 500.0000 mL | Freq: Once | INTRAVENOUS | Status: DC

## 2020-09-28 NOTE — Progress Notes (Signed)
Report to PACU, RN, vss, BBS= Clear.  

## 2020-09-28 NOTE — Op Note (Signed)
[facility] Patient Name: Peter Lyons Procedure Date: 09/28/2020 9:24 AM MRN: [id number] Endoscopist: Tressia Danas MD, MD Age: 50 Referring MD:  Date of Birth: 22-May-1971 Gender: Male Account #: [id number] Procedure:                Colonoscopy Indications:              Screening for colorectal malignant neoplasm, This                            is the patient's first colonoscopy                           No known family history of colon cance or polyps Medicines:                Monitored Anesthesia Care Procedure:                Pre-Anesthesia Assessment:                           - Prior to the procedure, a History and Physical                            was performed, and patient medications and                            allergies were reviewed. The patient's tolerance of                            previous anesthesia was also reviewed. The risks                            and benefits of the procedure and the sedation                            options and risks were discussed with the patient.                            All questions were answered, and informed consent                            was obtained. Prior Anticoagulants: The patient has                            taken no previous anticoagulant or antiplatelet                            agents. ASA Grade Assessment: II - A patient with                            mild systemic disease. After reviewing the risks                            and benefits, the patient was deemed in  satisfactory condition to undergo the procedure.                           After obtaining informed consent, the colonoscope                            was passed under direct vision. Throughout the                            procedure, the patient's blood pressure, pulse, and                            oxygen saturations were monitored continuously. The                            Olympus CF-HQ190L  (Serial# [id number]) Colonoscope was                            introduced through the anus and advanced to the the                            cecum, identified by appendiceal orifice and                            ileocecal valve. The colonoscopy was technically                            difficult and complex due to a redundant colon,                            significant looping and a tortuous colon.                            Successful completion of the procedure was aided by                            changing the patient's position, withdrawing and                            reinserting the scope and applying abdominal                            pressure. The patient tolerated the procedure well.                            The quality of the bowel preparation was good. The                            ileocecal valve, appendiceal orifice, and rectum                            were photographed. Scope In: 9:32:26 AM Scope Out: 9:49:38 AM Scope Withdrawal Time: 0 hours 13 minutes 12 seconds  Total Procedure Duration: 0 hours 17  minutes 12 seconds  Findings:                 Small, non-bleeding hemorrhoids were found on                            perianal exam.                           A single (solitary) 3-4 mm linear ulcer was found                            in the sigmoid colon, 36 cm from the anal verge. No                            bleeding was present. Biopsies were taken with a                            cold forceps for histology. Estimated blood loss                            was minimal.                           The colon is tortuous and redundant. The exam was                            otherwise without abnormality on direct and                            retroflexion views. Complications:            No immediate complications. Estimated blood loss:                            Minimal. Estimated Blood Loss:     Estimated blood loss was minimal. Impression:               -  Hemorrhoids found on perianal exam.                           - A single (solitary) ulcer in the sigmoid colon.                            This may be related to NSAIDs such as ibuprofen.                            Biopsied.                           - The examination was otherwise normal on direct                            and retroflexion views. Recommendation:           - Patient has a contact number available for  emergencies. The signs and symptoms of potential                            delayed complications were discussed with the                            patient. Return to normal activities tomorrow.                            Written discharge instructions were provided to the                            patient.                           - Resume previous diet.                           - Continue present medications.                           - Await pathology results.                           - Repeat colonoscopy in 10 years for surveillance,                            earlier with new symptoms.                           - Avoid all NSAIDs including ibuprofen.                           - Emerging evidence supports eating a diet of                            fruits, vegetables, grains, calcium, and yogurt                            while reducing red meat and alcohol may reduce the                            risk of colon cancer.                           - Thank you for allowing me to be involved in your                            colon cancer prevention. Tressia Danas MD, MD 09/28/2020 10:06:38 AM This report has been signed electronically.

## 2020-09-28 NOTE — Patient Instructions (Signed)
Thank you for allowing [place] to care for you today!  Await pathology results, approximately 7-10 days.  Will notify you of results.  Recommend next screening colonoscopy in 10 years, sooner if any new symptoms arise.  Avoid all NSAIDS ( Ibuprofen, Aspirin, Alleve/Naprosyn)  Resume  Previous diet and medications today.  Resume your normal daily activities tomorrow.    YOU HAD AN ENDOSCOPIC PROCEDURE TODAY AT THE [facility]:   Refer to the procedure report that was given to you for any specific questions about what was found during the examination.  If the procedure report does not answer your questions, please call your gastroenterologist to clarify.  If you requested that your care partner not be given the details of your procedure findings, then the procedure report has been included in a sealed envelope for you to review at your convenience later.  YOU SHOULD EXPECT: Some feelings of bloating in the abdomen. Passage of more gas than usual.  Walking can help get rid of the air that was put into your GI tract during the procedure and reduce the bloating. If you had a lower endoscopy (such as a colonoscopy or flexible sigmoidoscopy) you may notice spotting of blood in your stool or on the toilet paper. If you underwent a bowel prep for your procedure, you may not have a normal bowel movement for a few days.  Please Note:  You might notice some irritation and congestion in your nose or some drainage.  This is from the oxygen used during your procedure.  There is no need for concern and it should clear up in a day or so.  SYMPTOMS TO REPORT IMMEDIATELY:   Following lower endoscopy (colonoscopy or flexible sigmoidoscopy):  Excessive amounts of blood in the stool  Significant tenderness or worsening of abdominal pains  Swelling of the abdomen that is new, acute  Fever of 100F or higher   For urgent or emergent issues, a gastroenterologist can be reached at any hour by calling  [phone]. Do not use MyChart messaging for urgent concerns.    DIET:  We do recommend a small meal at first, but then you may proceed to your regular diet.  Drink plenty of fluids but you should avoid alcoholic beverages for 24 hours.  ACTIVITY:  You should plan to take it easy for the rest of today and you should NOT DRIVE or use heavy machinery until tomorrow (because of the sedation medicines used during the test).    FOLLOW UP: Our staff will call the number listed on your records 48-72 hours following your procedure to check on you and address any questions or concerns that you may have regarding the information given to you following your procedure. If we do not reach you, we will leave a message.  We will attempt to reach you two times.  During this call, we will ask if you have developed any symptoms of COVID 19. If you develop any symptoms (ie: fever, flu-like symptoms, shortness of breath, cough etc.) before then, please call [phone].  If you test positive for Covid 19 in the 2 weeks post procedure, please call and report this information to [place].    If any biopsies were taken you will be contacted by phone or by letter within the next 1-3 weeks.  Please call us at [phone] if you have not heard about the biopsies in 3 weeks.    SIGNATURES/CONFIDENTIALITY: You and/or your care partner have signed paperwork which will be  entered into your electronic medical record.  These signatures attest to the fact that that the information above on your After Visit Summary has been reviewed and is understood.  Full responsibility of the confidentiality of this discharge information lies with you and/or your care-partner. 

## 2020-09-28 NOTE — Progress Notes (Signed)
Pt's states no medical or surgical changes since previsit or office visit.  Vitals SB 

## 2020-09-29 ENCOUNTER — Encounter: Payer: Self-pay | Admitting: Family Medicine

## 2020-09-29 ENCOUNTER — Other Ambulatory Visit: Payer: Self-pay | Admitting: Family Medicine

## 2020-09-29 NOTE — Progress Notes (Signed)
Please update patient.  Per GI, recommendation is to hold NSAIDs such as ibuprofen or diclofenac.  Update his med list.  Tylenol would be okay.  Thanks.

## 2020-09-30 ENCOUNTER — Telehealth: Payer: Self-pay | Admitting: *Deleted

## 2020-09-30 NOTE — Progress Notes (Signed)
Spoke with patient and advised about below. Patient states this was discussed at his GI visit as well.

## 2020-09-30 NOTE — Telephone Encounter (Signed)
  Follow up Call-  Call back number 09/28/2020  Post procedure Call Back phone  # [phone]  Permission to leave phone message Yes  Some recent data might be hidden     Patient questions:  Do you have a fever, pain , or abdominal swelling? No. Pain Score  0 *  Have you tolerated food without any problems? Yes.    Have you been able to return to your normal activities? Yes.    Do you have any questions about your discharge instructions: Diet   No. Medications  No. Follow up visit  No.  Do you have questions or concerns about your Care? No.  Actions: * If pain score is 4 or above: No action needed, pain <4.  1. Have you developed a fever since your procedure? no  2.   Have you had an respiratory symptoms (SOB or cough) since your procedure? no  3.   Have you tested positive for COVID 19 since your procedure no  4.   Have you had any family members/close contacts diagnosed with the COVID 19 since your procedure?  no   If yes to any of these questions please route to Laverna Peace, RN and Karlton Lemon, RN

## 2020-11-02 ENCOUNTER — Telehealth: Payer: Self-pay | Admitting: Family Medicine

## 2020-11-02 NOTE — Telephone Encounter (Signed)
Patient called in stating he does have nerve pain associated with his arthritis and has been managing the pain with advil, tylenol, etc. Patient recently had a colonoscopy and they found a 26mm ulcer and was instructed to stop that medicine regimen. Patient is calling to inquire what the PCP recommends he take to assist with pain, if he has to stop completely, what he recommends he do now. No appointment made at this time to discuss. Please advise. Pt made aware PCP out of office. Voiced understanding.

## 2020-11-07 NOTE — Telephone Encounter (Signed)
Can he get by using 2 extra strength Tylenol 2-3 times a day?  That would be the safest option and it would not be an issue with his previous ulcer.  Thanks.

## 2020-11-08 NOTE — Telephone Encounter (Signed)
Spoke with patient and advised on below message. Patient will try this and let us know if does not help in a couple of weeks.

## 2021-06-06 ENCOUNTER — Other Ambulatory Visit: Payer: 59

## 2021-06-13 ENCOUNTER — Ambulatory Visit (INDEPENDENT_AMBULATORY_CARE_PROVIDER_SITE_OTHER): Payer: 59 | Admitting: Family Medicine

## 2021-06-13 ENCOUNTER — Encounter: Payer: Self-pay | Admitting: Family Medicine

## 2021-06-13 ENCOUNTER — Other Ambulatory Visit: Payer: Self-pay

## 2021-06-13 VITALS — BP 122/82 | HR 66 | Temp 97.6°F | Ht 70.0 in | Wt 177.0 lb

## 2021-06-13 DIAGNOSIS — Z125 Encounter for screening for malignant neoplasm of prostate: Secondary | ICD-10-CM

## 2021-06-13 DIAGNOSIS — Z7189 Other specified counseling: Secondary | ICD-10-CM

## 2021-06-13 DIAGNOSIS — Z8249 Family history of ischemic heart disease and other diseases of the circulatory system: Secondary | ICD-10-CM

## 2021-06-13 DIAGNOSIS — Z23 Encounter for immunization: Secondary | ICD-10-CM | POA: Diagnosis not present

## 2021-06-13 DIAGNOSIS — Z0000 Encounter for general adult medical examination without abnormal findings: Secondary | ICD-10-CM | POA: Diagnosis not present

## 2021-06-13 DIAGNOSIS — M25519 Pain in unspecified shoulder: Secondary | ICD-10-CM

## 2021-06-13 LAB — LIPID PANEL
Cholesterol: 157 mg/dL (ref 0–200)
HDL: 51.4 mg/dL (ref >39.00)
LDL Cholesterol: 96 mg/dL (ref 0–99)
NonHDL: 105.81
Total CHOL/HDL Ratio: 3
Triglycerides: 49 mg/dL (ref 0.0–149.0)
VLDL: 9.8 mg/dL (ref 0.0–40.0)

## 2021-06-13 LAB — BASIC METABOLIC PANEL
BUN: 19 mg/dL (ref 6–23)
CO2: 28 mEq/L (ref 19–32)
Calcium: 8.8 mg/dL (ref 8.4–10.5)
Chloride: 105 mEq/L (ref 96–112)
Creatinine, Ser: 0.97 mg/dL (ref 0.40–1.50)
GFR: 90.6 mL/min (ref >60.00)
Glucose, Bld: 94 mg/dL (ref 70–99)
Potassium: 4.5 mEq/L (ref 3.5–5.1)
Sodium: 139 mEq/L (ref 135–145)

## 2021-06-13 LAB — PSA: PSA: 0.67 ng/mL (ref 0.10–4.00)

## 2021-06-13 MED ORDER — VALACYCLOVIR HCL 1 G PO TABS: 1000.0000 mg | ORAL_TABLET | Freq: Every day | ORAL | 3 refills | Status: DC | PRN

## 2021-06-13 NOTE — Patient Instructions (Addendum)
Flu shot today.  Go to the lab on the way out.   If you have mychart we'll likely use that to update you.    Take care.  Glad to see you. I'll await the notes from ortho.

## 2021-06-13 NOTE — Progress Notes (Signed)
This visit occurred during the SARS-CoV-2 public health emergency.  Safety protocols were in place, including screening questions prior to the visit, additional usage of staff PPE, and extensive cleaning of exam room while observing appropriate contact time as indicated for disinfecting solutions.  CPE- See plan.  Routine anticipatory guidance given to patient.  See health maintenance.  The possibility exists that previously documented standard health maintenance information may have been brought forward from a previous encounter into this note.  If needed, that same information has been updated to reflect the current situation based on today's encounter.    Tetanus 2016 Flu prev 2022 PNA and shingles not due.  He had varicella vaccine 2015 covid vaccine prev done.   Colonoscopy 2021 Prostate cancer screening and PSA options (with potential risks and benefits of testing vs not testing) were discussed along with recent recs/guidelines.  He optef for testing PSA. Living will d/w pt.  Brother Peter Lyons designated if patient were incapacitated.   Diet and exercise d/w pt.  Avid mountain biker.  Diet is good.   HIV/HCV screening prev done.    L arm numbness with cervical radiculopathy.  S/p prednisone.  Then he fell and hit his shoulder.  He had epidural injection via neurosurgery clinic.  He is active but his L shoulder still has decreased ROM.  He had MRI done with rotator cuff tear per patient report with f/u pending with ortho.  His L hand strength is improving but he still has tingling in the L hand.  He is still biking and doing well with that but he is careful about falling.    See notes on labs.     Cyst still present on dorsal R 2nd toe.  occ drains clear fluid.  He'll update me as needed.  I asked him to check with ortho.     He is engaged to be married and happy about that.    He is taking valtrex. It works.  No ADE on med.  Taken as proph, about twice a week.  Would continue as is, d/w pt.   He agrees.    PMH and SH reviewed  Meds, vitals, and allergies reviewed.   ROS: Per HPI.  Unless specifically indicated otherwise in HPI, the patient denies:  General: fever. Eyes: acute vision changes ENT: sore throat Cardiovascular: chest pain Respiratory: SOB GI: vomiting GU: dysuria Musculoskeletal: acute back pain Derm: acute rash Neuro: acute motor dysfunction Psych: worsening mood Endocrine: polydipsia Heme: bleeding Allergy: hayfever  GEN: nad, alert and oriented HEENT: ncat NECK: supple w/o LA CV: rrr. PULM: ctab, no inc wob ABD: soft, +bs EXT: no edema SKIN: no acute rash He had dec muscle mass in the L thenar area.   Small cyst on R 2nd dorsal toe.   Small cyst, not inflamed, R side of neck.  No ulceration.  <1cm.

## 2021-06-17 NOTE — Assessment & Plan Note (Signed)
I will await ortho f/u notes.  He agrees.

## 2021-06-17 NOTE — Assessment & Plan Note (Signed)
Tetanus 2016 Flu prev 2022 PNA and shingles not due.  He had varicella vaccine 2015 covid vaccine prev done.   Colonoscopy 2021 Prostate cancer screening and PSA options (with potential risks and benefits of testing vs not testing) were discussed along with recent recs/guidelines.  He optef for testing PSA. Living will d/w pt.  Brother [name] designated if patient were incapacitated.   Diet and exercise d/w pt.  Avid mountain biker.  Diet is good.   HIV/HCV screening prev done.

## 2021-06-17 NOTE — Assessment & Plan Note (Signed)
Living will d/w pt.  Brother [name] designated if patient were incapacitated.

## 2021-06-28 HISTORY — PX: ROTATOR CUFF REPAIR: SHX139

## 2021-07-05 ENCOUNTER — Encounter: Payer: 59 | Admitting: Family Medicine

## 2021-11-03 ENCOUNTER — Encounter: Payer: Self-pay | Admitting: Gastroenterology

## 2021-11-03 ENCOUNTER — Other Ambulatory Visit: Payer: Self-pay

## 2021-11-03 ENCOUNTER — Ambulatory Visit (AMBULATORY_SURGERY_CENTER): Payer: 59 | Admitting: *Deleted

## 2021-11-03 VITALS — Ht 70.0 in | Wt 178.0 lb

## 2021-11-03 DIAGNOSIS — K633 Ulcer of intestine: Secondary | ICD-10-CM

## 2021-11-03 MED ORDER — SUTAB 1479-225-188 MG PO TABS: 1.0000 | ORAL_TABLET | ORAL | 0 refills | Status: DC

## 2021-11-03 NOTE — Progress Notes (Signed)
No egg or soy allergy known to patient  ?No issues known to pt with past sedation with any surgeries or procedures ?Patient denies ever being told they had issues or difficulty with intubation  ?No FH of Malignant Hyperthermia ?Pt is not on diet pills ?Pt is not on  home 02  ?Pt is not on blood thinners  ?Pt denies issues with constipation  ?No A fib or A flutter ? ?Pt is fully vaccinated  for Covid  ? ?Sutab Coupon to pt in PV today , Code to Pharmacy and  NO PA's for preps discussed with pt In PV today  ?Discussed with pt there will be an out-of-pocket cost for prep and that varies from $0 to 70 +  dollars - pt verbalized understanding  ? ?Due to the COVID-19 pandemic we are asking patients to follow certain guidelines in PV and the [facility]   ?Pt aware of COVID protocols and [facility] guidelines  ? ?PV completed over the phone. Pt verified name, DOB, address and insurance during PV today.  ? ?Pt encouraged to call with questions or issues.  ?If pt has My chart, procedure instructions sent via My Chart   ?

## 2021-11-07 ENCOUNTER — Telehealth: Payer: Self-pay | Admitting: Occupational Therapy

## 2021-11-07 NOTE — Telephone Encounter (Signed)
Patient will contact pharmacy and ask them to use the sutab coupon attached to Rx. He will call us back if needed. ?

## 2021-11-07 NOTE — Telephone Encounter (Signed)
Patient called requesting to speak to a nurse regarding medication not being covered Peter Lyons).  ?

## 2021-11-10 ENCOUNTER — Ambulatory Visit (AMBULATORY_SURGERY_CENTER): Payer: 59 | Admitting: Gastroenterology

## 2021-11-10 ENCOUNTER — Encounter: Payer: Self-pay | Admitting: Gastroenterology

## 2021-11-10 VITALS — BP 117/73 | HR 56 | Temp 97.7°F | Resp 13 | Ht 70.0 in | Wt 178.0 lb

## 2021-11-10 DIAGNOSIS — K648 Other hemorrhoids: Secondary | ICD-10-CM | POA: Diagnosis not present

## 2021-11-10 DIAGNOSIS — Z8719 Personal history of other diseases of the digestive system: Secondary | ICD-10-CM | POA: Diagnosis present

## 2021-11-10 MED ORDER — SODIUM CHLORIDE 0.9 % IV SOLN
500.0000 mL | Freq: Once | INTRAVENOUS | Status: DC
Start: 2021-11-10 — End: 2021-11-10

## 2021-11-10 NOTE — Progress Notes (Signed)
Pt's states no medical or surgical changes since previsit or office visit.  ° °VS DT °

## 2021-11-10 NOTE — Progress Notes (Signed)
? ?  Referring Provider: Joaquim Nam, MD ?Primary Care Physician:  Joaquim Nam, MD ? ?Reason for Procedure:  Follow-up sigmoid ulcer ? ? ?IMPRESSION:  ?History of sigmoid colon ulcer of unclear etiolgoy - ? NSAIDs ?Appropriate candidate for monitored anesthesia care ? ?PLAN: ?Col[facility]oscopy in the [facility] today ? ? ?HPI: Peter Lyons is a 51 y.o. male presents for colonoscopy. ? ?Sigmoid ulcer seen in colonoscopy 09/2020 36 cm from the anal verge. Pathology revealed no obvious etiology for the ulcer. Repeat exam recommended in one year to follow-up on the ulcer.  ? ?No baseline GI symptoms.  ? ?No known family history of colon cancer or polyps. No family history of uterine/endometrial cancer, pancreatic cancer or gastric/stomach cancer. ? ? ?Past Medical History:  ?Diagnosis Date  ? Cervical radiculopathy   ? Rotator cuff tear   ? ? ?Past Surgical History:  ?Procedure Laterality Date  ? COLONOSCOPY  09/28/2020  ? COLONOSCOPY WITH PROPOFOL  11/10/2021  ? KNEE ARTHROSCOPY W/ ACL RECONSTRUCTION  02/2009  ? Left, allograft, Dean  ? KNEE ARTHROSCOPY W/ PARTIAL MEDIAL MENISCECTOMY  02/2009  ? Left, August Saucer  ? LEFT KNEE TIBIAL PLATEAU FRACTURE  02/2010  ? Left, August Saucer  ? ROTATOR CUFF REPAIR  06/2021  ? ? ?Current Outpatient Medications  ?Medication Sig Dispense Refill  ? valACYclovir (VALTREX) 1000 MG tablet Take 1 tablet (1,000 mg total) by mouth daily as needed. 90 tablet 3  ? ?Current Facility-Administered Medications  ?Medication Dose Route Frequency Provider Last Rate Last Admin  ? 0.9 %  sodium chloride infusion  500 mL Intravenous Once Tressia Danas, MD      ? ? ?Allergies as of 11/10/2021 - Review Complete 11/10/2021  ?Allergen Reaction Noted  ? Nsaids  09/29/2020  ? ? ?Family History  ?Problem Relation Age of Onset  ? Heart disease Mother   ?     CAD, CABG x 3  ? COPD Mother   ? Diabetes Father   ? Stroke Neg Hx   ? Colon cancer Neg Hx   ? Prostate cancer Neg Hx   ? Colon polyps Neg Hx   ? Esophageal cancer  Neg Hx   ? Rectal cancer Neg Hx   ? Stomach cancer Neg Hx   ? ? ? ?Physical Exam: ?General:   Alert,  well-nourished, pleasant and cooperative in NAD ?Head:  Normocephalic and atraumatic. ?Eyes:  Sclera clear, no icterus.   Conjunctiva pink. ?Mouth:  No deformity or lesions.   ?Neck:  Supple; no masses or thyromegaly. ?Lungs:  Clear throughout to auscultation.   No wheezes. ?Heart:  Regular rate and rhythm; no murmurs. ?Abdomen:  Soft, non-tender, nondistended, normal bowel sounds, no rebound or guarding.  ?Msk:  Symmetrical. No boney deformities ?LAD: No inguinal or umbilical LAD ?Extremities:  No clubbing or edema. ?Neurologic:  Alert and  oriented x4;  grossly nonfocal ?Skin:  No obvious rash or bruise. ?Psych:  Alert and cooperative. Normal mood and affect. ? ? ? ? ?Studies/Results: ?No results found. ? ? ? ?Elva Breaker L. Orvan Falconer, MD, MPH ?11/10/2021, 1:53 PM ? ? ? ?  ?

## 2021-11-10 NOTE — Patient Instructions (Signed)
Resume previous diet and medications. Repeat Colonoscopy in 10 years for surveillance.  YOU HAD AN ENDOSCOPIC PROCEDURE TODAY AT THE [facility]:   Refer to the procedure report that was given to you for any specific questions about what was found during the examination.  If the procedure report does not answer your questions, please call your gastroenterologist to clarify.  If you requested that your care partner not be given the details of your procedure findings, then the procedure report has been included in a sealed envelope for you to review at your convenience later.  YOU SHOULD EXPECT: Some feelings of bloating in the abdomen. Passage of more gas than usual.  Walking can help get rid of the air that was put into your GI tract during the procedure and reduce the bloating. If you had a lower endoscopy (such as a colonoscopy or flexible sigmoidoscopy) you may notice spotting of blood in your stool or on the toilet paper. If you underwent a bowel prep for your procedure, you may not have a normal bowel movement for a few days.  Please Note:  You might notice some irritation and congestion in your nose or some drainage.  This is from the oxygen used during your procedure.  There is no need for concern and it should clear up in a day or so.  SYMPTOMS TO REPORT IMMEDIATELY:  Following lower endoscopy (colonoscopy or flexible sigmoidoscopy):  Excessive amounts of blood in the stool  Significant tenderness or worsening of abdominal pains  Swelling of the abdomen that is new, acute  Fever of 100F or higher  For urgent or emergent issues, a gastroenterologist can be reached at any hour by calling [phone]. Do not use MyChart messaging for urgent concerns.    DIET:  We do recommend a small meal at first, but then you may proceed to your regular diet.  Drink plenty of fluids but you should avoid alcoholic beverages for 24 hours.  ACTIVITY:  You should plan to take it easy for the  rest of today and you should NOT DRIVE or use heavy machinery until tomorrow (because of the sedation medicines used during the test).    FOLLOW UP: Our staff will call the number listed on your records 48-72 hours following your procedure to check on you and address any questions or concerns that you may have regarding the information given to you following your procedure. If we do not reach you, we will leave a message.  We will attempt to reach you two times.  During this call, we will ask if you have developed any symptoms of COVID 19. If you develop any symptoms (ie: fever, flu-like symptoms, shortness of breath, cough etc.) before then, please call [phone].  If you test positive for Covid 19 in the 2 weeks post procedure, please call and report this information to us.    If any biopsies were taken you will be contacted by phone or by letter within the next 1-3 weeks.  Please call us at [phone] if you have not heard about the biopsies in 3 weeks.    SIGNATURES/CONFIDENTIALITY: You and/or your care partner have signed paperwork which will be entered into your electronic medical record.  These signatures attest to the fact that that the information above on your After Visit Summary has been reviewed and is understood.  Full responsibility of the confidentiality of this discharge information lies with you and/or your care-partner.  

## 2021-11-10 NOTE — Progress Notes (Signed)
VSS, transported to PACU °

## 2021-11-10 NOTE — Op Note (Signed)
[facility] ?Patient Name: Peter Lyons ?Procedure Date: 11/10/2021 2:25 PM ?MRN: [id number] ?Endoscopist: Tressia Danas MD, MD ?Age: 51 ?Referring MD:  ?Date of Birth: [birth year] ?Gender: Male ?Account #: [id number] ?Procedure:                Colonoscopy ?Indications:              Personal history of digestive disease ?                          Sigmoid ulcer seen on colonoscopy 2022 - biopsies  ?                          did not revealed a source ?Medicines:                Monitored Anesthesia Care ?Procedure:                Pre-Anesthesia Assessment: ?                          - Prior to the procedure, a History and Physical  ?                          was performed, and patient medications and  ?                          allergies were reviewed. The patient's tolerance of  ?                          previous anesthesia was also reviewed. The risks  ?                          and benefits of the procedure and the sedation  ?                          options and risks were discussed with the patient.  ?                          All questions were answered, and informed consent  ?                          was obtained. Prior Anticoagulants: The patient has  ?                          taken no previous anticoagulant or antiplatelet  ?                          agents. ASA Grade Assessment: I - A normal, healthy  ?                          patient. After reviewing the risks and benefits,  ?                          the patient was deemed in satisfactory condition to  ?  undergo the procedure. ?                          After obtaining informed consent, the colonoscope  ?                          was passed under direct vision. Throughout the  ?                          procedure, the patient's blood pressure, pulse, and  ?                          oxygen saturations were monitored continuously. The  ?                          CF [id number] #[id number] was introduced through the anus  ?                           and advanced to the 3 cm into the ileum. A second  ?                          forward view of the right colon was performed. The  ?                          colonoscopy was performed without difficulty. The  ?                          patient tolerated the procedure well. The quality  ?                          of the bowel preparation was good. The terminal  ?                          ileum, ileocecal valve, appendiceal orifice, and  ?                          rectum were photographed. ?Scope In: 2:36:02 PM ?Scope Out: 2:52:00 PM ?Scope Withdrawal Time: 0 hours 11 minutes 57 seconds  ?Total Procedure Duration: 0 hours 15 minutes 58 seconds  ?Findings:                 The perianal and digital rectal examinations were  ?                          normal. ?                          Non-bleeding internal hemorrhoids were found. ?                          The entire examined colon appeared normal. ?                          The terminal ileum appeared normal. ?  The exam was otherwise without abnormality on  ?                          direct and retroflexion views. ?Complications:            No immediate complications. ?Estimated Blood Loss:     Estimated blood loss: none. ?Impression:               - Non-bleeding internal hemorrhoids. ?                          - The entire examined colon and terminal ileum was  ?                          normal. ?                          - No colonic uclers seen. ?                          - Ulcer on colonoscopy 2022 likely due to NSAIDs. ?Recommendation:           - Patient has a contact number available for  ?                          emergencies. The signs and symptoms of potential  ?                          delayed complications were discussed with the  ?                          patient. Return to normal activities tomorrow.  ?                          Written discharge instructions were provided to the  ?                           patient. ?                          - Patient has a contact number available for  ?                          emergencies. The signs and symptoms of potential  ?                          delayed complications were discussed with the  ?                          patient. Return to normal activities tomorrow.  ?                          Written discharge instructions were provided to the  ?                          patient. ?                          -  Resume previous diet. ?                          - Continue present medications. ?                          - Repeat colonoscopy in 10 years for surveillance,  ?                          earlier with new symptoms. ?                          - Emerging evidence supports eating a diet of  ?                          fruits, vegetables, grains, calcium, and yogurt  ?                          while reducing red meat and alcohol may reduce the  ?                          risk of colon cancer. ?                          - Limit the use of all non-steroidal  ?                          anti-inflammatory medications. ?Tressia Danas MD, MD ?11/10/2021 2:59:51 PM ?This report has been signed electronically. ?

## 2021-11-14 ENCOUNTER — Telehealth: Payer: Self-pay | Admitting: *Deleted

## 2021-11-14 NOTE — Telephone Encounter (Signed)
?  Follow up Call- ? ?Call back number 11/10/2021 09/28/2020  ?Post procedure Call Back phone  # [phone]  ?Permission to leave phone message Yes Yes  ?Some recent data might be hidden  ?  ? ?Patient questions: ? ?Do you have a fever, pain , or abdominal swelling? No. ?Pain Score  0 * ? ?Have you tolerated food without any problems? Yes.   ? ?Have you been able to return to your normal activities? Yes.   ? ?Do you have any questions about your discharge instructions: ?Diet   No. ?Medications  No. ?Follow up visit  No. ? ?Do you have questions or concerns about your Care? No. ? ?Actions: ?* If pain score is 4 or above: ?No action needed, pain <4. ? ? ?

## 2022-01-16 ENCOUNTER — Encounter: Payer: 59 | Admitting: Gastroenterology

## 2022-04-17 ENCOUNTER — Telehealth: Payer: Self-pay | Admitting: Family Medicine

## 2022-04-17 NOTE — Telephone Encounter (Signed)
Patient states its on his neck (right Side). It has gotten red and inflamed; tender if touched and tight feeling. Patient wanted to see if it could be drained sooner then his derm appt on 8/31. Dr. Para March is booked until 8/28 and he asked if someone else could see him for this. Dr. Milinda Antis had an opening on 8/23 at 9:30 am I scheduled him for.

## 2022-04-17 NOTE — Telephone Encounter (Signed)
Is this the cyst he had on his toe?  If so, I thought he was going to check with ortho.   If it is another lesion, then let me know.  Thanks.  Marland Kitchen

## 2022-04-17 NOTE — Telephone Encounter (Signed)
[email or web address]

## 2022-04-17 NOTE — Telephone Encounter (Signed)
Patient called in and stated that last year during his physical you all found a cyst. He stated the cyst is about the size of a quarter, sticking out and red. He contacted the dermatologist and the earliest they could get him in is August 31st. He was wondering if this is something you can do, should he wait until August 31st, should it be done immediately, or is there no concern. Please advise. Thank you!

## 2022-04-17 NOTE — Telephone Encounter (Signed)
See if he can do 9:30 tomorrow AM with me.  Thanks.

## 2022-04-19 ENCOUNTER — Encounter: Payer: Self-pay | Admitting: Family Medicine

## 2022-04-19 ENCOUNTER — Ambulatory Visit (INDEPENDENT_AMBULATORY_CARE_PROVIDER_SITE_OTHER): Payer: 59 | Admitting: Family Medicine

## 2022-04-19 VITALS — BP 110/68 | HR 51 | Temp 98.2°F | Ht 70.0 in | Wt 181.8 lb

## 2022-04-19 DIAGNOSIS — L089 Local infection of the skin and subcutaneous tissue, unspecified: Secondary | ICD-10-CM | POA: Diagnosis not present

## 2022-04-19 DIAGNOSIS — L723 Sebaceous cyst: Secondary | ICD-10-CM | POA: Diagnosis not present

## 2022-04-19 DIAGNOSIS — R2241 Localized swelling, mass and lump, right lower limb: Secondary | ICD-10-CM

## 2022-04-19 DIAGNOSIS — R2240 Localized swelling, mass and lump, unspecified lower limb: Secondary | ICD-10-CM | POA: Insufficient documentation

## 2022-04-19 MED ORDER — CEPHALEXIN 500 MG PO CAPS: 500.0000 mg | ORAL_CAPSULE | Freq: Three times a day (TID) | ORAL | 0 refills | Status: DC

## 2022-04-19 NOTE — Assessment & Plan Note (Signed)
Pt is interested in removal and has appt with derm on 8/31 Will treat infection prior with keflex 500 mg tid for 10 d Warm compresses prn and keep clean If it drains- cover lightly and keep clean  inst to watch for worse redness/swelling or pain -then call or seek care after hours  F/u with derm for further eval and likely removal

## 2022-04-19 NOTE — Assessment & Plan Note (Signed)
Chronic lump over patella of R knee/anterior (does not bother him at all) for years  Per pt started as an injury and he works on [employer] if this is a patellar bursitis/scar vs skin issue   Will consult derm as planned

## 2022-04-19 NOTE — Progress Notes (Signed)
Subjective:    Patient ID: Peter Lyons, male    DOB: 1971/04/23, 51 y.o.   MRN: [id number]  HPI 51 yo pt of Dr Para March presents with an inflamed cyst   Wt Readings from Last 3 Encounters:  04/19/22 181 lb 12.8 oz (82.5 kg)  11/10/21 178 lb (80.7 kg)  11/03/21 178 lb (80.7 kg)   26.09 kg/m  Cyst on neck area - got worse a week ago  There for a while Now pain/tender and warmth  Not draining   Was prev no bigger then a pencil eraser  Thought to be a seb cyst   No fever  No streaking  Is softer today    Used heat and ice   Has derm appt later this mo  Has old cyst on R 2nd toe-not bothering him now  Old knee injury-has permanent lump over patella on R    Review of Systems     Objective:   Physical Exam Constitutional:      Appearance: He is normal weight.  HENT:     Head: Atraumatic.  Eyes:     General:        Right eye: No discharge.        Left eye: No discharge.     Conjunctiva/sclera: Conjunctivae normal.     Pupils: Pupils are equal, round, and reactive to light.  Cardiovascular:     Rate and Rhythm: Regular rhythm. Bradycardia present.     Heart sounds: Normal heart sounds.  Pulmonary:     Effort: Pulmonary effort is normal. No respiratory distress.     Breath sounds: Normal breath sounds. No wheezing.  Musculoskeletal:     Cervical back: No tenderness.  Lymphadenopathy:     Cervical: No cervical adenopathy.  Skin:    General: Skin is warm and dry.     Coloration: Skin is not pale.     Findings: Erythema present. No rash.     Comments: 2 cm erythematous cyst on R posterior neck Soft/fluctuant and mildly tender  No drainage or skin opening   No streaks or surrounding skin change   Tanned Solar lentigines diffusely  Small lump over dorsal R 2nd toe -non tender and not erythematous (per pt drains clear occ)   Lump over R knee /anterior-no erythema or fluctuance (per pt chronic and post traumatic)     Neurological:     General: No  focal deficit present.     Mental Status: He is alert.  Psychiatric:        Mood and Affect: Mood normal.           Assessment & Plan:   Problem List Items Addressed This Visit       Musculoskeletal and Integument   Infected sebaceous cyst - Primary    Pt is interested in removal and has appt with derm on 8/31 Will treat infection prior with keflex 500 mg tid for 10 d Warm compresses prn and keep clean If it drains- cover lightly and keep clean  inst to watch for worse redness/swelling or pain -then call or seek care after hours  F/u with derm for further eval and likely removal       Relevant Medications   cephALEXin (KEFLEX) 500 MG capsule     Other   Localized swelling, mass, or lump of lower extremity    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

## 2022-04-19 NOTE — Patient Instructions (Addendum)
I think you have an infected sebaceous cyst  Take the keflex as directed  Use warm compresses  Keep very clean with antibacterial soap and water  If symptoms worsen or no improvement in redness/discomfort- let us know   Keep your appt with dermatologist  They may need to remove this

## 2022-06-04 ENCOUNTER — Other Ambulatory Visit: Payer: Self-pay | Admitting: Family Medicine

## 2022-06-04 DIAGNOSIS — Z8249 Family history of ischemic heart disease and other diseases of the circulatory system: Secondary | ICD-10-CM

## 2022-06-04 DIAGNOSIS — Z125 Encounter for screening for malignant neoplasm of prostate: Secondary | ICD-10-CM

## 2022-06-07 ENCOUNTER — Other Ambulatory Visit: Payer: 59

## 2022-06-07 ENCOUNTER — Other Ambulatory Visit (INDEPENDENT_AMBULATORY_CARE_PROVIDER_SITE_OTHER): Payer: 59

## 2022-06-07 DIAGNOSIS — Z8249 Family history of ischemic heart disease and other diseases of the circulatory system: Secondary | ICD-10-CM

## 2022-06-07 DIAGNOSIS — Z125 Encounter for screening for malignant neoplasm of prostate: Secondary | ICD-10-CM

## 2022-06-07 LAB — LIPID PANEL
Cholesterol: 135 mg/dL (ref 0–200)
HDL: 35.3 mg/dL — ABNORMAL LOW (ref >39.00)
LDL Cholesterol: 91 mg/dL (ref 0–99)
NonHDL: 99.44
Total CHOL/HDL Ratio: 4
Triglycerides: 42 mg/dL (ref 0.0–149.0)
VLDL: 8.4 mg/dL (ref 0.0–40.0)

## 2022-06-07 LAB — BASIC METABOLIC PANEL
BUN: 15 mg/dL (ref 6–23)
CO2: 28 mEq/L (ref 19–32)
Calcium: 8.6 mg/dL (ref 8.4–10.5)
Chloride: 105 mEq/L (ref 96–112)
Creatinine, Ser: 0.89 mg/dL (ref 0.40–1.50)
GFR: 98.77 mL/min (ref >60.00)
Glucose, Bld: 89 mg/dL (ref 70–99)
Potassium: 4.3 mEq/L (ref 3.5–5.1)
Sodium: 140 mEq/L (ref 135–145)

## 2022-06-07 LAB — PSA: PSA: 0.5 ng/mL (ref 0.10–4.00)

## 2022-06-15 ENCOUNTER — Ambulatory Visit (INDEPENDENT_AMBULATORY_CARE_PROVIDER_SITE_OTHER)
Admission: RE | Admit: 2022-06-15 | Discharge: 2022-06-15 | Disposition: A | Discharge status: Home / Self Care | Payer: 59 | Source: Ambulatory Visit | Attending: Family Medicine | Admitting: Family Medicine

## 2022-06-15 ENCOUNTER — Ambulatory Visit (INDEPENDENT_AMBULATORY_CARE_PROVIDER_SITE_OTHER): Payer: 59 | Admitting: Family Medicine

## 2022-06-15 ENCOUNTER — Encounter: Payer: Self-pay | Admitting: Family Medicine

## 2022-06-15 VITALS — BP 118/64 | HR 57 | Temp 97.7°F | Ht 70.0 in | Wt 182.0 lb

## 2022-06-15 DIAGNOSIS — R2241 Localized swelling, mass and lump, right lower limb: Secondary | ICD-10-CM | POA: Diagnosis not present

## 2022-06-15 DIAGNOSIS — Z23 Encounter for immunization: Secondary | ICD-10-CM | POA: Diagnosis not present

## 2022-06-15 DIAGNOSIS — M25519 Pain in unspecified shoulder: Secondary | ICD-10-CM

## 2022-06-15 DIAGNOSIS — Z0000 Encounter for general adult medical examination without abnormal findings: Secondary | ICD-10-CM

## 2022-06-15 DIAGNOSIS — Z7189 Other specified counseling: Secondary | ICD-10-CM

## 2022-06-15 MED ORDER — VALACYCLOVIR HCL 1 G PO TABS: 1000.0000 mg | ORAL_TABLET | Freq: Every day | ORAL | 3 refills | Status: DC | PRN

## 2022-06-15 NOTE — Patient Instructions (Signed)
Go to the lab on the way out.   If you have mychart we'll likely use that to update you.    Likely need to talk to [facility] about your knee.   Take care.  Glad to see you.

## 2022-06-15 NOTE — Progress Notes (Addendum)
CPE- See plan.  Routine anticipatory guidance given to patient.  See health maintenance.  The possibility exists that previously documented standard health maintenance information may have been brought forward from a previous encounter into this note.  If needed, that same information has been updated to reflect the current situation based on today's encounter.    Tetanus 2016 Flu prev 2023 PNA and shingles not due.  He had varicella vaccine 2015 covid vaccine prev done.   Colonoscopy 2023 Prostate cancer screening and PSA options (with potential risks and benefits of testing vs not testing) were discussed along with recent recs/guidelines.  He optef for testing PSA. Living will d/w pt.  Wife Jinny Blossom designated if patient were incapacitated.   Diet and exercise d/w pt.  Avid mountain biker.  Diet is good.   HIV/HCV screening prev done.    He had mult steroid injections per neurosurgery and L rotator cuff surgery.   He is still biking and doing well with that.  He had shoulder sx after swimming at the beach.  Now with some pain with some movement, not the same pain as prior, before surgery.  He had neck MRI yesterday, I don't have the report yet, d/w pt.  He is going to f/u about that and update me as needed.    He had derm f/u pending.    Variable sized mass on the R distal patella.  Not inflamed but pain kneeling.  Normal ROM.  Still biking.   PMH and SH reviewed  Meds, vitals, and allergies reviewed.   ROS: Per HPI.  Unless specifically indicated otherwise in HPI, the patient denies:  General: fever. Eyes: acute vision changes ENT: sore throat Cardiovascular: chest pain Respiratory: SOB GI: vomiting GU: dysuria Musculoskeletal: acute back pain Derm: acute rash Neuro: acute motor dysfunction Psych: worsening mood Endocrine: polydipsia Heme: bleeding Allergy: hayfever  GEN: nad, alert and oriented HEENT: mucous membranes moist NECK: supple w/o LA CV: rrr. PULM: ctab, no inc  wob ABD: soft, +bs EXT: no edema SKIN: no acute rash R knee with mass near the distal patella. Not ttp.  Not red.  Joint line not ttp.

## 2022-06-15 NOTE — Assessment & Plan Note (Signed)
Living will d/w pt.  Wife Jinny Blossom designated if patient were incapacitated.

## 2022-06-18 DIAGNOSIS — R2241 Localized swelling, mass and lump, right lower limb: Secondary | ICD-10-CM | POA: Insufficient documentation

## 2022-06-18 NOTE — Assessment & Plan Note (Signed)
Tetanus 2016 Flu prev 2023 PNA and shingles not due.  He had varicella vaccine 2015 covid vaccine prev done.   Colonoscopy 2023 Prostate cancer screening and PSA options (with potential risks and benefits of testing vs not testing) were discussed along with recent recs/guidelines.  He optef for testing PSA. Living will d/w pt.  Wife Jinny Blossom designated if patient were incapacitated.   Diet and exercise d/w pt.  Avid mountain biker.  Diet is good.   HIV/HCV screening prev done.

## 2022-06-18 NOTE — Assessment & Plan Note (Signed)
See notes on plain films. 

## 2022-06-18 NOTE — Assessment & Plan Note (Signed)
Which could be related to his neck.  He had neck MRI yesterday, I don't have the report yet, d/w pt.  He is going to f/u about that and update me as needed.

## 2023-06-12 ENCOUNTER — Other Ambulatory Visit (INDEPENDENT_AMBULATORY_CARE_PROVIDER_SITE_OTHER): Payer: 59

## 2023-06-12 ENCOUNTER — Other Ambulatory Visit: Payer: Self-pay | Admitting: Family Medicine

## 2023-06-12 DIAGNOSIS — Z8249 Family history of ischemic heart disease and other diseases of the circulatory system: Secondary | ICD-10-CM

## 2023-06-12 DIAGNOSIS — Z125 Encounter for screening for malignant neoplasm of prostate: Secondary | ICD-10-CM | POA: Diagnosis not present

## 2023-06-12 LAB — BASIC METABOLIC PANEL
BUN: 16 mg/dL (ref 6–23)
CO2: 30 meq/L (ref 19–32)
Calcium: 9 mg/dL (ref 8.4–10.5)
Chloride: 104 meq/L (ref 96–112)
Creatinine, Ser: 1 mg/dL (ref 0.40–1.50)
GFR: 86.13 mL/min (ref >60.00)
Glucose, Bld: 80 mg/dL (ref 70–99)
Potassium: 4.7 meq/L (ref 3.5–5.1)
Sodium: 140 meq/L (ref 135–145)

## 2023-06-12 LAB — LIPID PANEL
Cholesterol: 144 mg/dL (ref 0–200)
HDL: 43 mg/dL (ref >39.00)
LDL Cholesterol: 87 mg/dL (ref 0–99)
NonHDL: 100.77
Total CHOL/HDL Ratio: 3
Triglycerides: 70 mg/dL (ref 0.0–149.0)
VLDL: 14 mg/dL (ref 0.0–40.0)

## 2023-06-12 LAB — PSA: PSA: 0.45 ng/mL (ref 0.10–4.00)

## 2023-06-19 ENCOUNTER — Ambulatory Visit: Payer: 59 | Admitting: Family Medicine

## 2023-06-19 ENCOUNTER — Encounter: Payer: Self-pay | Admitting: Family Medicine

## 2023-06-19 VITALS — BP 116/70 | HR 49 | Temp 98.2°F | Ht 69.0 in | Wt 166.4 lb

## 2023-06-19 DIAGNOSIS — Z7189 Other specified counseling: Secondary | ICD-10-CM

## 2023-06-19 DIAGNOSIS — Z23 Encounter for immunization: Secondary | ICD-10-CM | POA: Diagnosis not present

## 2023-06-19 DIAGNOSIS — K649 Unspecified hemorrhoids: Secondary | ICD-10-CM

## 2023-06-19 DIAGNOSIS — Z0000 Encounter for general adult medical examination without abnormal findings: Secondary | ICD-10-CM

## 2023-06-19 MED ORDER — TRIAMCINOLONE ACETONIDE 0.1 % EX CREA: 1.0000 | TOPICAL_CREAM | Freq: Two times a day (BID) | CUTANEOUS | 1 refills | Status: DC | PRN

## 2023-06-19 NOTE — Patient Instructions (Signed)
If you have more trouble with hemorrhoids then let me know and we can refer you to GI.  Take care.  Glad to see you. Thanks for your effort.

## 2023-06-19 NOTE — Progress Notes (Unsigned)
CPE- See plan.  Routine anticipatory guidance given to patient.  See health maintenance.  The possibility exists that previously documented standard health maintenance information may have been brought forward from a previous encounter into this note.  If needed, that same information has been updated to reflect the current situation based on today's encounter.    Tetanus 2016 Flu prev 2024 PNA and shingles not due.  He had varicella vaccine 2015 covid vaccine prev done.   Colonoscopy 2023 Prostate cancer screening and PSA options (with potential risks and benefits of testing vs not testing) were discussed along with recent recs/guidelines.  He optef for testing PSA. Living will d/w pt.  Wife Aundra Millet designated if patient were incapacitated.   Diet and exercise d/w pt.  Avid mountain biker.  Diet is good.   HIV/HCV screening prev done.    Dealing with hemorrhoids.  Bleeding occ.  Some itching.  Improved with witch hazel and TAC cream.  Rx resent.    Intentional weight loss, d/w pt.    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    His neck pain is better.  L arm sx are better.  Still with some tingling in the L hand but better than prior.    PMH and SH reviewed  Meds, vitals, and allergies reviewed.   ROS: Per HPI.  Unless specifically indicated otherwise in HPI, the patient denies:  General: fever. Eyes: acute vision changes ENT: sore throat Cardiovascular: chest pain Respiratory: SOB GI: vomiting GU: dysuria Musculoskeletal: acute back pain Derm: acute rash Neuro: acute motor dysfunction Psych: worsening mood Endocrine: polydipsia Heme: bleeding Allergy: hayfever  GEN: nad, alert and oriented HEENT: mucous membranes moist NECK: supple w/o LA, healed scar from neck surgery noted. CV: rrr. PULM: ctab, no inc wob ABD: soft, +bs EXT: no edema SKIN: no acute rash

## 2023-06-20 NOTE — Assessment & Plan Note (Signed)
Tetanus 2016 Flu prev 2024 PNA and shingles not due.  He had varicella vaccine 2015 covid vaccine prev done.   Colonoscopy 2023 Prostate cancer screening and PSA options (with potential risks and benefits of testing vs not testing) were discussed along with recent recs/guidelines.  He optef for testing PSA. Living will d/w pt.  Wife Aundra Millet designated if patient were incapacitated.   Diet and exercise d/w pt.  Avid mountain biker.  Diet is good.   HIV/HCV screening prev done.

## 2023-06-20 NOTE — Assessment & Plan Note (Signed)
Living will d/w pt.  Wife Jinny Blossom designated if patient were incapacitated.

## 2023-06-20 NOTE — Assessment & Plan Note (Signed)
Some itching.  Improved with witch hazel and TAC cream.  Rx resent.   He can update me as needed and we can refer back to GI if he wants to talk to them about banding.

## 2023-11-26 ENCOUNTER — Ambulatory Visit (INDEPENDENT_AMBULATORY_CARE_PROVIDER_SITE_OTHER): Admitting: Family Medicine

## 2023-11-26 ENCOUNTER — Encounter: Payer: Self-pay | Admitting: Family Medicine

## 2023-11-26 VITALS — BP 106/64 | HR 65 | Temp 98.5°F | Ht 69.0 in | Wt 172.2 lb

## 2023-11-26 DIAGNOSIS — H6690 Otitis media, unspecified, unspecified ear: Secondary | ICD-10-CM | POA: Diagnosis not present

## 2023-11-26 DIAGNOSIS — M47812 Spondylosis without myelopathy or radiculopathy, cervical region: Secondary | ICD-10-CM | POA: Insufficient documentation

## 2023-11-26 MED ORDER — AMOXICILLIN-POT CLAVULANATE 875-125 MG PO TABS: 1.0000 | ORAL_TABLET | Freq: Two times a day (BID) | ORAL | 0 refills | Status: DC

## 2023-11-26 NOTE — Patient Instructions (Signed)
 Rest and fluids, start augmentin.  Should gradually resolve.  Tylenol as needed.   Take care.  Glad to see you.

## 2023-11-26 NOTE — Progress Notes (Unsigned)
 Sx for about 1 week, scratchy/ST. Runny nose, some cough.  He didn't feel well in general but then got some better.  Has been taking benadryl prn.  Still with some occ cough.  Then last night with R ear pain. Took a few advil at the time. Then woke up this AM w/o pain but now with ringing in the ear. Ringing continues at OV.   Hearing intact in the R ear.  No L sided sx. No fevers, no chills since initial night of sx last week.  No facial pain.    Meds, vitals, and allergies reviewed.   ROS: Per HPI unless specifically indicated in ROS section   R TM red Ctab Rrr Sinuses not ttp.

## 2023-11-28 DIAGNOSIS — H6690 Otitis media, unspecified, unspecified ear: Secondary | ICD-10-CM | POA: Insufficient documentation

## 2023-11-28 NOTE — Assessment & Plan Note (Signed)
 Rest and fluids, start augmentin.  Should gradually resolve.  Tylenol as needed.   Update me as needed.  He agrees.

## 2024-06-02 ENCOUNTER — Other Ambulatory Visit: Payer: Self-pay | Admitting: Family Medicine

## 2024-06-02 DIAGNOSIS — Z8249 Family history of ischemic heart disease and other diseases of the circulatory system: Secondary | ICD-10-CM

## 2024-06-02 DIAGNOSIS — Z125 Encounter for screening for malignant neoplasm of prostate: Secondary | ICD-10-CM

## 2024-06-12 ENCOUNTER — Other Ambulatory Visit (INDEPENDENT_AMBULATORY_CARE_PROVIDER_SITE_OTHER): Payer: 59

## 2024-06-12 DIAGNOSIS — Z8249 Family history of ischemic heart disease and other diseases of the circulatory system: Secondary | ICD-10-CM | POA: Diagnosis not present

## 2024-06-12 DIAGNOSIS — Z125 Encounter for screening for malignant neoplasm of prostate: Secondary | ICD-10-CM

## 2024-06-12 LAB — LIPID PANEL
Cholesterol: 125 mg/dL (ref 0–200)
HDL: 40.1 mg/dL (ref >39.00)
LDL Cholesterol: 70 mg/dL (ref 0–99)
NonHDL: 84.56
Total CHOL/HDL Ratio: 3
Triglycerides: 73 mg/dL (ref 0.0–149.0)
VLDL: 14.6 mg/dL (ref 0.0–40.0)

## 2024-06-12 LAB — BASIC METABOLIC PANEL WITH GFR
BUN: 16 mg/dL (ref 6–23)
CO2: 28 meq/L (ref 19–32)
Calcium: 8.7 mg/dL (ref 8.4–10.5)
Chloride: 105 meq/L (ref 96–112)
Creatinine, Ser: 0.93 mg/dL (ref 0.40–1.50)
GFR: 93.31 mL/min (ref >60.00)
Glucose, Bld: 94 mg/dL (ref 70–99)
Potassium: 4.7 meq/L (ref 3.5–5.1)
Sodium: 140 meq/L (ref 135–145)

## 2024-06-12 LAB — PSA: PSA: 0.47 ng/mL (ref 0.10–4.00)

## 2024-06-15 ENCOUNTER — Ambulatory Visit: Payer: Self-pay | Admitting: Family Medicine

## 2024-06-19 ENCOUNTER — Ambulatory Visit: Payer: 59 | Admitting: Family Medicine

## 2024-06-19 ENCOUNTER — Encounter: Payer: Self-pay | Admitting: Family Medicine

## 2024-06-19 VITALS — BP 130/68 | HR 47 | Temp 98.4°F | Ht 69.5 in | Wt 174.2 lb

## 2024-06-19 DIAGNOSIS — Z7189 Other specified counseling: Secondary | ICD-10-CM

## 2024-06-19 DIAGNOSIS — M7050 Other bursitis of knee, unspecified knee: Secondary | ICD-10-CM

## 2024-06-19 DIAGNOSIS — Z0000 Encounter for general adult medical examination without abnormal findings: Secondary | ICD-10-CM

## 2024-06-19 DIAGNOSIS — K649 Unspecified hemorrhoids: Secondary | ICD-10-CM

## 2024-06-19 DIAGNOSIS — Z23 Encounter for immunization: Secondary | ICD-10-CM

## 2024-06-19 MED ORDER — TRIAMCINOLONE ACETONIDE 0.1 % EX CREA: 1.0000 | TOPICAL_CREAM | Freq: Two times a day (BID) | CUTANEOUS | 1 refills | Status: AC | PRN

## 2024-06-19 MED ORDER — VALACYCLOVIR HCL 1 G PO TABS: 1000.0000 mg | ORAL_TABLET | Freq: Every day | ORAL | 3 refills | Status: AC | PRN

## 2024-06-19 NOTE — Progress Notes (Signed)
 CPE- See plan.  Routine anticipatory guidance given to patient.  See health maintenance.  The possibility exists that previously documented standard health maintenance information may have been brought forward from a previous encounter into this note.  If needed, that same information has been updated to reflect the current situation based on today's encounter.    Tetanus 2016 Flu prev 2024 PNA d/w pt.  Shingles not due.  He had varicella vaccine 2015 covid vaccine prev done.   Colonoscopy 2023 Prostate cancer screening and PSA options (with potential risks and benefits of testing vs not testing) were discussed along with recent recs/guidelines.  He optef for testing PSA. Living will d/w pt.  Wife Duwaine designated if patient were incapacitated.   Diet and exercise d/w pt.  Avid mountain biker.  Diet is good.   HIV/HCV screening prev done.    Bradycardia likely fitness related.  Frequent cycling.  He enjoys it.    Prev neck pain is better.  He still has some days that are better than others.  Minimal tingling in the L 4th and 5th fingers- that is better from prior.  Motor function is back to normal.  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  He feels better when well rested.  Pillow is still in good shape.  Massage helps with neck pain.  Today is a good day.    Using TAC prn for skin irritation/hemorrhoids.    R infrapatellar bursa swelling improved and smaller per patient report.  No pain.  See exam.  PMH and SH reviewed  Meds, vitals, and allergies reviewed.   ROS: Per HPI.  Unless specifically indicated otherwise in HPI, the patient denies:  General: fever. Eyes: acute vision changes ENT: sore throat Cardiovascular: chest pain Respiratory: SOB GI: vomiting GU: dysuria Musculoskeletal: acute back pain Derm: acute rash Neuro: acute motor dysfunction Psych: worsening mood Endocrine: polydipsia Heme: bleeding Allergy: hayfever  GEN: nad, alert and oriented HEENT:  mucous membranes moist NECK: supple w/o LA CV: rrr. PULM: ctab, no inc wob ABD: soft, +bs EXT: no edema SKIN: no acute rash Right infrapatellar bursa area is puffy compared to the left side.  Is not tender or red.  No bruising.  Nontender.  Normal knee range of motion.

## 2024-06-19 NOTE — Patient Instructions (Signed)
 Update me as needed.  Thanks for you effort.  Take care.  Glad to see you.

## 2024-06-22 DIAGNOSIS — M7050 Other bursitis of knee, unspecified knee: Secondary | ICD-10-CM | POA: Insufficient documentation

## 2024-06-22 NOTE — Assessment & Plan Note (Signed)
  Tetanus 2016 Flu prev 2024 PNA d/w pt.  Shingles not due.  He had varicella vaccine 2015 covid vaccine prev done.   Colonoscopy 2023 Prostate cancer screening and PSA options (with potential risks and benefits of testing vs not testing) were discussed along with recent recs/guidelines.  He optef for testing PSA. Living will d/w pt.  Wife Duwaine designated if patient were incapacitated.   Diet and exercise d/w pt.  Avid mountain biker.  Diet is good.   HIV/HCV screening prev done.

## 2024-06-22 NOTE — Assessment & Plan Note (Signed)
 It looks like he has residual changes from infrapatellar bursitis.  It is not tender.  Anatomy discussed with patient.  It is not a large lesion and I do not think it makes sense to intervene now.  He can update me as needed.

## 2024-06-22 NOTE — Assessment & Plan Note (Signed)
Living will d/w pt.  Wife Jinny Blossom designated if patient were incapacitated.

## 2024-06-22 NOTE — Assessment & Plan Note (Signed)
 Continue topical triamcinolone  if needed.

## 2025-06-15 ENCOUNTER — Other Ambulatory Visit

## 2025-06-22 ENCOUNTER — Encounter: Admitting: Family Medicine
